# Patient Record
Sex: Male | Born: 1935 | Race: White | Hispanic: No | Marital: Married | Smoking: Former smoker
Health system: Southern US, Community
[De-identification: ages and names within clinical notes are randomized; demographics above are authoritative.]

## PROBLEM LIST (undated history)

## (undated) DIAGNOSIS — T4145XA Adverse effect of unspecified anesthetic, initial encounter: Secondary | ICD-10-CM

## (undated) DIAGNOSIS — Z9289 Personal history of other medical treatment: Secondary | ICD-10-CM

## (undated) DIAGNOSIS — G5601 Carpal tunnel syndrome, right upper limb: Secondary | ICD-10-CM

## (undated) DIAGNOSIS — Z789 Other specified health status: Secondary | ICD-10-CM

## (undated) DIAGNOSIS — I10 Essential (primary) hypertension: Secondary | ICD-10-CM

## (undated) DIAGNOSIS — M1990 Unspecified osteoarthritis, unspecified site: Secondary | ICD-10-CM

## (undated) DIAGNOSIS — I493 Ventricular premature depolarization: Secondary | ICD-10-CM

## (undated) DIAGNOSIS — Z86010 Personal history of colonic polyps: Secondary | ICD-10-CM

## (undated) DIAGNOSIS — R2689 Other abnormalities of gait and mobility: Secondary | ICD-10-CM

## (undated) DIAGNOSIS — N189 Chronic kidney disease, unspecified: Secondary | ICD-10-CM

## (undated) DIAGNOSIS — R609 Edema, unspecified: Secondary | ICD-10-CM

## (undated) DIAGNOSIS — N289 Disorder of kidney and ureter, unspecified: Secondary | ICD-10-CM

## (undated) DIAGNOSIS — E119 Type 2 diabetes mellitus without complications: Secondary | ICD-10-CM

## (undated) DIAGNOSIS — T8859XA Other complications of anesthesia, initial encounter: Secondary | ICD-10-CM

## (undated) DIAGNOSIS — Z8679 Personal history of other diseases of the circulatory system: Secondary | ICD-10-CM

## (undated) DIAGNOSIS — I82409 Acute embolism and thrombosis of unspecified deep veins of unspecified lower extremity: Secondary | ICD-10-CM

## (undated) DIAGNOSIS — K219 Gastro-esophageal reflux disease without esophagitis: Secondary | ICD-10-CM

## (undated) DIAGNOSIS — I509 Heart failure, unspecified: Secondary | ICD-10-CM

## (undated) DIAGNOSIS — M109 Gout, unspecified: Secondary | ICD-10-CM

## (undated) DIAGNOSIS — Z860101 Personal history of adenomatous and serrated colon polyps: Secondary | ICD-10-CM

## (undated) HISTORY — PX: TONSILLECTOMY: SUR1361

## (undated) HISTORY — DX: Edema, unspecified: R60.9

## (undated) HISTORY — DX: Personal history of adenomatous and serrated colon polyps: Z86.0101

## (undated) HISTORY — PX: APPENDECTOMY: SHX54

## (undated) HISTORY — DX: Carpal tunnel syndrome, right upper limb: G56.01

## (undated) HISTORY — PX: CATARACT EXTRACTION W/ INTRAOCULAR LENS IMPLANT: SHX1309

## (undated) HISTORY — PX: VENA CAVA FILTER PLACEMENT: SUR1032

## (undated) HISTORY — DX: Other abnormalities of gait and mobility: R26.89

## (undated) HISTORY — DX: Personal history of colonic polyps: Z86.010

## (undated) HISTORY — DX: Disorder of kidney and ureter, unspecified: N28.9

## (undated) HISTORY — DX: Chronic kidney disease, unspecified: N18.9

## (undated) HISTORY — DX: Personal history of other medical treatment: Z92.89

## (undated) HISTORY — DX: Other specified health status: Z78.9

## (undated) HISTORY — DX: Ventricular premature depolarization: I49.3

## (undated) HISTORY — DX: Essential (primary) hypertension: I10

## (undated) HISTORY — DX: Unspecified osteoarthritis, unspecified site: M19.90

## (undated) HISTORY — PX: EYE SURGERY: SHX253

## (undated) HISTORY — DX: Personal history of other diseases of the circulatory system: Z86.79

---

## 1989-08-01 ENCOUNTER — Encounter: Payer: Self-pay | Admitting: Gastroenterology

## 1998-06-06 ENCOUNTER — Encounter: Admission: RE | Admit: 1998-06-06 | Discharge: 1998-09-04 | Payer: Self-pay | Admitting: Internal Medicine

## 1999-04-21 ENCOUNTER — Encounter (INDEPENDENT_AMBULATORY_CARE_PROVIDER_SITE_OTHER): Payer: Self-pay | Admitting: Specialist

## 1999-04-21 ENCOUNTER — Other Ambulatory Visit: Admission: RE | Admit: 1999-04-21 | Discharge: 1999-04-21 | Payer: Self-pay | Admitting: Gastroenterology

## 2000-10-25 ENCOUNTER — Ambulatory Visit (HOSPITAL_COMMUNITY): Admission: RE | Admit: 2000-10-25 | Discharge: 2000-10-25 | Payer: Self-pay | Admitting: *Deleted

## 2001-10-31 ENCOUNTER — Encounter: Admission: RE | Admit: 2001-10-31 | Discharge: 2001-10-31 | Payer: Self-pay | Admitting: Internal Medicine

## 2001-12-19 HISTORY — PX: CARDIAC CATHETERIZATION: SHX172

## 2001-12-29 ENCOUNTER — Inpatient Hospital Stay (HOSPITAL_COMMUNITY): Admission: EM | Admit: 2001-12-29 | Discharge: 2002-01-04 | Payer: Self-pay | Admitting: Emergency Medicine

## 2001-12-29 ENCOUNTER — Encounter: Payer: Self-pay | Admitting: Emergency Medicine

## 2002-02-22 ENCOUNTER — Encounter: Payer: Self-pay | Admitting: *Deleted

## 2002-02-22 ENCOUNTER — Ambulatory Visit (HOSPITAL_COMMUNITY): Admission: RE | Admit: 2002-02-22 | Discharge: 2002-02-22 | Payer: Self-pay | Admitting: *Deleted

## 2002-02-28 ENCOUNTER — Encounter: Payer: Self-pay | Admitting: *Deleted

## 2002-02-28 ENCOUNTER — Ambulatory Visit (HOSPITAL_COMMUNITY): Admission: RE | Admit: 2002-02-28 | Discharge: 2002-02-28 | Payer: Self-pay | Admitting: *Deleted

## 2002-03-08 ENCOUNTER — Ambulatory Visit (HOSPITAL_COMMUNITY): Admission: RE | Admit: 2002-03-08 | Discharge: 2002-03-08 | Payer: Self-pay | Admitting: *Deleted

## 2002-07-31 ENCOUNTER — Ambulatory Visit (HOSPITAL_COMMUNITY): Admission: RE | Admit: 2002-07-31 | Discharge: 2002-07-31 | Payer: Self-pay | Admitting: *Deleted

## 2003-11-23 ENCOUNTER — Encounter (INDEPENDENT_AMBULATORY_CARE_PROVIDER_SITE_OTHER): Payer: Self-pay | Admitting: Gastroenterology

## 2003-11-25 ENCOUNTER — Ambulatory Visit (HOSPITAL_COMMUNITY): Admission: RE | Admit: 2003-11-25 | Discharge: 2003-11-25 | Payer: Self-pay | Admitting: Gastroenterology

## 2003-12-03 ENCOUNTER — Ambulatory Visit (HOSPITAL_COMMUNITY): Admission: RE | Admit: 2003-12-03 | Discharge: 2003-12-03 | Payer: Self-pay | Admitting: Gastroenterology

## 2004-08-01 ENCOUNTER — Ambulatory Visit: Payer: Self-pay | Admitting: Hematology & Oncology

## 2004-10-12 ENCOUNTER — Encounter: Admission: RE | Admit: 2004-10-12 | Discharge: 2004-10-12 | Payer: Self-pay | Admitting: Family Medicine

## 2004-12-25 ENCOUNTER — Inpatient Hospital Stay (HOSPITAL_COMMUNITY): Admission: EM | Admit: 2004-12-25 | Discharge: 2005-01-01 | Payer: Self-pay | Admitting: Emergency Medicine

## 2004-12-25 ENCOUNTER — Encounter (INDEPENDENT_AMBULATORY_CARE_PROVIDER_SITE_OTHER): Payer: Self-pay | Admitting: Cardiology

## 2005-11-18 ENCOUNTER — Ambulatory Visit (HOSPITAL_COMMUNITY): Admission: RE | Admit: 2005-11-18 | Discharge: 2005-11-18 | Payer: Self-pay | Admitting: *Deleted

## 2006-05-26 ENCOUNTER — Emergency Department (HOSPITAL_COMMUNITY): Admission: EM | Admit: 2006-05-26 | Discharge: 2006-05-26 | Payer: Self-pay | Admitting: Emergency Medicine

## 2007-07-18 ENCOUNTER — Encounter: Admission: RE | Admit: 2007-07-18 | Discharge: 2007-07-18 | Payer: Self-pay | Admitting: Family Medicine

## 2007-08-04 ENCOUNTER — Ambulatory Visit: Payer: Self-pay | Admitting: Gastroenterology

## 2007-08-29 ENCOUNTER — Ambulatory Visit: Payer: Self-pay | Admitting: Gastroenterology

## 2007-08-29 ENCOUNTER — Encounter: Payer: Self-pay | Admitting: Gastroenterology

## 2007-08-29 DIAGNOSIS — K222 Esophageal obstruction: Secondary | ICD-10-CM | POA: Insufficient documentation

## 2007-11-02 DIAGNOSIS — K219 Gastro-esophageal reflux disease without esophagitis: Secondary | ICD-10-CM

## 2007-11-02 DIAGNOSIS — K5730 Diverticulosis of large intestine without perforation or abscess without bleeding: Secondary | ICD-10-CM | POA: Insufficient documentation

## 2007-11-02 DIAGNOSIS — K649 Unspecified hemorrhoids: Secondary | ICD-10-CM | POA: Insufficient documentation

## 2007-11-02 DIAGNOSIS — I080 Rheumatic disorders of both mitral and aortic valves: Secondary | ICD-10-CM

## 2007-11-02 DIAGNOSIS — K449 Diaphragmatic hernia without obstruction or gangrene: Secondary | ICD-10-CM | POA: Insufficient documentation

## 2007-11-02 DIAGNOSIS — I10 Essential (primary) hypertension: Secondary | ICD-10-CM | POA: Insufficient documentation

## 2007-11-02 DIAGNOSIS — I5022 Chronic systolic (congestive) heart failure: Secondary | ICD-10-CM

## 2007-11-02 DIAGNOSIS — E785 Hyperlipidemia, unspecified: Secondary | ICD-10-CM

## 2008-07-04 ENCOUNTER — Ambulatory Visit: Admission: RE | Admit: 2008-07-04 | Discharge: 2008-07-04 | Payer: Self-pay | Admitting: Cardiology

## 2008-07-04 ENCOUNTER — Encounter (INDEPENDENT_AMBULATORY_CARE_PROVIDER_SITE_OTHER): Payer: Self-pay | Admitting: Cardiology

## 2008-07-04 ENCOUNTER — Ambulatory Visit: Payer: Self-pay | Admitting: Surgery

## 2008-07-04 ENCOUNTER — Inpatient Hospital Stay (HOSPITAL_COMMUNITY): Admission: EM | Admit: 2008-07-04 | Discharge: 2008-07-06 | Payer: Self-pay | Admitting: Emergency Medicine

## 2008-11-15 DIAGNOSIS — Z860100 Personal history of colon polyps, unspecified: Secondary | ICD-10-CM | POA: Insufficient documentation

## 2009-01-28 ENCOUNTER — Encounter: Admission: RE | Admit: 2009-01-28 | Discharge: 2009-01-28 | Payer: Self-pay | Admitting: Gastroenterology

## 2010-10-28 ENCOUNTER — Ambulatory Visit
Admission: RE | Admit: 2010-10-28 | Discharge: 2010-10-28 | Disposition: A | Discharge status: Home / Self Care | Payer: Medicare Other | Source: Ambulatory Visit | Attending: Cardiology | Admitting: Cardiology

## 2010-10-28 ENCOUNTER — Other Ambulatory Visit: Payer: Self-pay | Admitting: Cardiology

## 2010-10-28 DIAGNOSIS — I82409 Acute embolism and thrombosis of unspecified deep veins of unspecified lower extremity: Secondary | ICD-10-CM

## 2010-11-03 ENCOUNTER — Other Ambulatory Visit: Payer: Self-pay | Admitting: Interventional Radiology

## 2010-11-03 DIAGNOSIS — I82409 Acute embolism and thrombosis of unspecified deep veins of unspecified lower extremity: Secondary | ICD-10-CM

## 2010-11-11 ENCOUNTER — Other Ambulatory Visit (HOSPITAL_COMMUNITY): Payer: Medicare Other

## 2010-11-11 ENCOUNTER — Ambulatory Visit (HOSPITAL_COMMUNITY)
Admission: RE | Admit: 2010-11-11 | Discharge: 2010-11-11 | Disposition: A | Discharge status: Home / Self Care | Payer: Medicare Other | Source: Ambulatory Visit | Attending: Interventional Radiology | Admitting: Interventional Radiology

## 2010-11-11 DIAGNOSIS — I129 Hypertensive chronic kidney disease with stage 1 through stage 4 chronic kidney disease, or unspecified chronic kidney disease: Secondary | ICD-10-CM | POA: Insufficient documentation

## 2010-11-11 DIAGNOSIS — Z01812 Encounter for preprocedural laboratory examination: Secondary | ICD-10-CM | POA: Insufficient documentation

## 2010-11-11 DIAGNOSIS — I82409 Acute embolism and thrombosis of unspecified deep veins of unspecified lower extremity: Secondary | ICD-10-CM

## 2010-11-11 DIAGNOSIS — J4489 Other specified chronic obstructive pulmonary disease: Secondary | ICD-10-CM | POA: Insufficient documentation

## 2010-11-11 DIAGNOSIS — Z86718 Personal history of other venous thrombosis and embolism: Secondary | ICD-10-CM | POA: Insufficient documentation

## 2010-11-11 DIAGNOSIS — Z7901 Long term (current) use of anticoagulants: Secondary | ICD-10-CM | POA: Insufficient documentation

## 2010-11-11 DIAGNOSIS — N189 Chronic kidney disease, unspecified: Secondary | ICD-10-CM | POA: Insufficient documentation

## 2010-11-11 DIAGNOSIS — R269 Unspecified abnormalities of gait and mobility: Secondary | ICD-10-CM | POA: Insufficient documentation

## 2010-11-11 DIAGNOSIS — Z9181 History of falling: Secondary | ICD-10-CM | POA: Insufficient documentation

## 2010-11-11 DIAGNOSIS — M129 Arthropathy, unspecified: Secondary | ICD-10-CM | POA: Insufficient documentation

## 2010-11-11 DIAGNOSIS — Z86711 Personal history of pulmonary embolism: Secondary | ICD-10-CM | POA: Insufficient documentation

## 2010-11-11 DIAGNOSIS — J449 Chronic obstructive pulmonary disease, unspecified: Secondary | ICD-10-CM | POA: Insufficient documentation

## 2010-11-11 DIAGNOSIS — Z91041 Radiographic dye allergy status: Secondary | ICD-10-CM | POA: Insufficient documentation

## 2010-11-11 DIAGNOSIS — E119 Type 2 diabetes mellitus without complications: Secondary | ICD-10-CM | POA: Insufficient documentation

## 2010-11-11 LAB — PROTIME-INR
INR: 1.65 — ABNORMAL HIGH (ref 0.00–1.49)
Prothrombin Time: 19.7 seconds — ABNORMAL HIGH (ref 11.6–15.2)

## 2010-11-11 LAB — CBC
MCHC: 34.4 g/dL (ref 30.0–36.0)
Platelets: 103 10*3/uL — ABNORMAL LOW (ref 150–400)
RDW: 12.8 % (ref 11.5–15.5)

## 2010-11-11 LAB — APTT: aPTT: 37 seconds (ref 24–37)

## 2010-11-11 LAB — COMPREHENSIVE METABOLIC PANEL
ALT: 31 U/L (ref 0–53)
Chloride: 105 mEq/L (ref 96–112)
GFR calc non Af Amer: >60 mL/min (ref >60)
Glucose, Bld: 163 mg/dL — ABNORMAL HIGH (ref 70–99)
Potassium: 5.4 mEq/L — ABNORMAL HIGH (ref 3.5–5.1)
Sodium: 138 mEq/L (ref 135–145)

## 2010-11-13 LAB — GLUCOSE, CAPILLARY: Glucose-Capillary: 146 mg/dL — ABNORMAL HIGH (ref 70–99)

## 2011-02-02 NOTE — H&P (Signed)
Ethan Mclean, DRUMMOND NO.:  [id number]   MEDICAL RECORD NO.:  [id number]          PATIENT TYPE:  INP   LOCATION:  3703                         FACILITY:  [facility]   PHYSICIAN:  [name], M.D.DATE OF BIRTH:  06/04/1926   DATE OF ADMISSION:  07/04/2008  DATE OF DISCHARGE:                              HISTORY & PHYSICAL   ATTENDING PHYSICIAN:  [name], M.D.   PRIMARY CARE PHYSICIAN:  [name].   CARDIOLOGIST:  [name], M.D.   CHIEF COMPLAINT:  Blood clot.   HISTORY OF PRESENT ILLNESS:  The patient is a 77 year old white male  with a past medical history of cardiomyopathy, hypertension and previous  history of DVT who was followed by Dr. [name] for his  cardiomyopathy.  In regards to the patient's baseline, he has been noted  to have some deconditioning and his wife states that he spends almost  his entire day in a chair, and his only movement is to go to the  bathroom, or to come to the table for a meal.  Other than that, he stays  in his chair all day.  He does not participate in any activity secondary  to some weakness and balance issues.  This has been going on for quite  some time.  There have been no recent car trips, no leg injuries, no  recent illnesses where he has been bed ridden.  He has a previous  history of DVT which led to a PE.  When today he went to go see Dr.  Mayford Knife,  he noted to [name] that he had been having some edema,  which had gotten better on his right side, but his left edema was  persisting.  The patient was sent over to radiology for lower extremity  Doppler, which was found to be positive for a large DVT in the thigh  area.  When this was found  [facility] called myself for admission.  Currently the patient is  doing well.  He denies any headaches, vision changes, dysphagia, chest  pain, palpitations, shortness of breath, wheezing, coughing abdominal  pain, hematuria, dysuria,  constipation, diarrhea.  He does have some  left lower extremity swelling and some overall weakness and balance  issues, but no focal extremity numbness, weakness or pain.  Review of  systems otherwise negative.   PAST MEDICAL HISTORY:  Includes history of nonischemic cardiomyopathy  with an EF of 40-45%, asymptomatic PVCs, hypertension, previous history  of DVT with PE, cause unknown, history of rhabdomyolysis secondary  statins.   ALLERGIES:  He has allergies to SULFA, IV DYE AND CODEINE.   SOCIAL HISTORY:  No tobacco, alcohol or drug use.   FAMILY HISTORY:  Noncontributory.   MEDICATIONS:  The patient is on:  1. Allopurinol 300.  2. Lisinopril to 20 b.i.d.  3. Coreg 25 b.i.d. - 0.4.  4. Fish oil 1200 four times daily.  5. Coenzyme-Q 60 mg daily.  6. Tylenol 325 p.r.n.  7. Os-Cal D daily.  8. Prilosec 20 b.i.d.  9. Metamucil daily.  10.He is on  Lasix dose and the dose is not written.   PHYSICAL EXAMINATION:  VITALS:  On admission were temperature 97.8,  heart rate 70, blood pressure 143/84, respirations 18, O2 saturation 96%  on room air.  GENERAL:  He is alert and oriented x3, in no apparent distress.  HEENT: Normocephalic atraumatic.  Mucous membranes are moist.  He has no  carotid bruits.  HEART:  Regular rate and rhythm.  S1, S2.  LUNGS:  Clear to auscultation bilaterally.  ABDOMEN: Soft, nontender , nondistended.  Positive bowel sounds.  EXTREMITIES:  Showed no clubbing, cyanosis or edema.   There are no laboratories.  I have ordered a CBC and BMET as well as a  PT/INR for the morning.   ASSESSMENT?PLAN:  1. Deep vein thrombosis.  Likely this is from inactivity.  I see no      other cause.  He has a previous history of deep vein thrombosis,      but it was only 4 years ago so I see no reason to think that he has      some sort of coagulation defect.  I would expect him to have      symptoms much earlier on in life.  We will plan to put him on      Coumadin as  well as Lovenox starting tonight.  We will ask home      health to ensure that he is able to get Lovenox at home.  Once this      is set up, we will be able to discharge home.  2. Deconditioning.  We will get physical therapy to see.  3. History of cardiomyopathy, stable.  4. History of diabetes mellitus.  He is on diet control only.  We will      just check his CBGs twice a day.  If it is elevated, we will put      him on sliding scale.      [name], M.D.  Electronically Signed     SKK/MEDQ  D:  07/04/2008  T:  07/04/2008  Job:  [id number]   cc:   [name], M.D.  Robert L. Foy Guadalajara, M.D.

## 2011-02-02 NOTE — Assessment & Plan Note (Signed)
[facility]                         GASTROENTEROLOGY OFFICE NOTE   NAME:Ethan Mclean, SCORPIO FORTIN                       MRN:          [id number]  DATE:08/04/2007                            DOB:          25-Dec-1925    REFERRING PHYSICIAN:  Molly Maduro L. Foy Guadalajara, M.D.   REASON FOR REFERRAL:  Dysphagia and reflux symptoms.   HISTORY OF PRESENT ILLNESS:  Mr. Ethan Mclean is a 77 year old white male who  was previously seen by Dr. Victorino Dike.  He has a history of GERD with  dysphagia.  Upper endoscopy in March 2005 showed a hiatal hernia and an  empiric dilation was done.  There was a question of distal esophageal  varices.  Colonoscopy in March 2005 showed diverticulosis and  hemorrhoids.  He has a prior history of adenomatous colon polyps in  March 1987.  There was marked atypia and the possibility of carcinoma in  situ could not be excluded per the pathology report.  It sounds like  this may have been high-grade dysplasia, but that is not so stated.  He  relates worsening problems with dysphagia to some foods, pills, and some  liquids. He has globus sensation as well.  He takes Zantac on a p.r.n.  basis for intermittent reflux symptoms.  He notes no weight loss,  melena, hematochezia, change in bowel habits, or odynophagia.   PAST MEDICAL HISTORY:  Hypertension  congestive heart failure with an ejection fraction of 20%  Mitral regurgitation  Diabetes mellitus  Hyperlipidemia  Adenomatous colon polyps with probable high-grade dysplasia in 1987  diverticulosis  hemorrhoids  GERD  status post appendectomy  status post tonsillectomy.   CURRENT MEDICATIONS:  Listed on the chart, updated, and reviewed.   MEDICATION ALLERGIES:  SULFA DRUGS, IVP DYE.   SOCIAL HISTORY AND REVIEW OF SYSTEMS:  Per the hand-written form.   PHYSICAL EXAM:  Well-developed, well-nourished in no acute distress.  Height 5 feet 11-3/4 inches, weight 206.8, blood pressure 108/64, pulse  56 and  regular.  HEENT:  Anicteric sclerae, oropharynx clear.  CHEST:  Clear to auscultation bilaterally.  CARDIAC:  Regular rate and rhythm without murmurs appreciated.  ABDOMEN:  Soft, nontender, nondistended.  Normoactive bowel sounds.  No  palpable organomegaly, masses, or hernias.  He has a small, easily  reducible umbilical hernia.  NEUROLOGIC:  Alert and oriented x3.  Grossly nonfocal.   ASSESSMENT AND PLAN:  1. Dysphagia and reflux symptoms.  He has globus sensation.  Begin      pantoprazole 40 mg p.o. q. a.m. with long-term antireflux measures.      Discontinue Zantac.  Rule out peptic strictures, esophageal      varices, and other disorders.  Risks, benefits, and alternatives to      upper endoscopy with possible biopsy and possible dilation      discussed with the patient.  He consents to proceed.  This will be      scheduled electively.  2. Personal history of adenomatous colon polyps with presumed high-      grade dysplasia in 1987.  Recall colonoscopy recommended for March  2010Judie Petit T. Russella Dar, MD, [facility]  Electronically Signed    MTS/MedQ  DD: 08/11/2007  DT: 08/11/2007  Job #: [id number]   cc:   Molly Maduro L. Foy Guadal[place]ara, M.D.

## 2011-02-05 NOTE — Discharge Summary (Signed)
[place]. [facility]  Patient:    Ethan Mclean, Ethan Mclean Visit Number: [id number] MRN: [id number]          Service Type: MED Location: [phone] Attending Physician:  Meade Maw A Dictated by:   Anselm Lis, N.P. Admit Date:  12/29/2001 Discharge Date: 01/04/2002   CC:         Casimiro Needle B. Sherene Sires, M.D. [facility]  Marinda Elk, M.D.  Venetia Constable, M.D.   Discharge Summary  DATE OF BIRTH:  10/22/1925.  CONSULTANT:  Charlaine Dalton. Sherene Sires, M.D. PRIMARY CARE Gage Weant:  Marinda Elk, M.D., also Venetia Constable, M.D.  PROCEDURES: 1. December 29, 2001, 2 D echo revealing mildly dilated LV with markedly    decreased function, EF 20-30%.  Severe diffuse LV hypokinesis.  Severe MR.    Left atrial enlargement, 44 mm.  Mild TR. 2. January 01, 2002, right and left cardiac catheterization revealing normal    coronary arteries, moderate to severe MR.  Dilated ventricle, EF 20% visaul    analysis, 34% computer analysis.  DISCHARGE DIAGNOSES: 1. Ischemic dilated cardiomyopathy.    a. January 01, 2002, cardiac catheterization revealing dilated LV with LV 20%       by visual analysis, 34% computerized analysis.  Normal coronary       arteries.  A 2 D echo, EF approximately 20-30%. 2. Severe mitral valvular regurgitation, probably secondary to dilated    cardiomyopathy. 3. Systemic anticoagulation secondary to dilated cardiomyopathy with low    ejection fraction for prophylaxis against cardioembolic source of stroke.    At time of discharge, prothrombin time was 14.8, INR 1.2.  She was initiated    on subcutaneous Lovenox 100 mg p.o. b.i.d. post catheterization.  Will    continue on this via the medical assistance program.  She was also    initiated on Coumadin 5 mg p.o. q.d. started January 03, 2002.  Will follow    up with our pharmacist in our Coumadin clinic with anticipation of    discontinuation of Lovenox when INR greater than 2. 4. Dyspnea/congestive heart failure  secondary to nonischemic dilated    cardiomyopathy, compensated after diuresis.  Chest CT was negative for    pulmonary emboli. 5. Nonsustained ventricular tachycardia related to ischemic cardiomyopathy.    Initiated on amiodarone on a load this admission.  Also, samples were    provided.  Did receive some samples via Sherlean Foot here at the    hospital.  He will be enrolled in the patient assistance program via    manufacturer of medicine.  The patient was initiated in the hospital. 6. Adenopathy, mediastinal and hilar, by chest CT.  Setting of weight loss    approximately 30 pounds over the last two months.  Consult by Dr. Sherene Sires.    Patients weight loss was attributed by patient to his inability to taste    food.  Sinus films done February 2003 at [facility] were negative for evidence of    active sinusitis.  Dr. Sherene Sires plans to obtain a chest CT from [facility]    and have it compared to the current CT scan done here this admission at    [facility] to see if adenopathy has worsened.  So there raises the    possibility of lymphoma, of bronchogenic carcinoma, or other forms of    carcinoma.  If adenopathy is new, may need mediastinoscopy.  The previous    CT scan done at [facility] (written report) revealed "  borderline enlarged    periaortic and retrocaval nodes" but no specific measurements. 7. Nasal congestion, for which he was started on Zyrtec. 8. History of cough previously to angiotensin-converting enzyme inhibitor,    which resolved with discontinuation of that medication.  It was recommended    not to resume angiotensin-converting enzyme inhibitor per Dr. Sherene Sires.  Trial    of angiotensin receptor blocker would be okay. 9. History of "organizing pneumonia" superior segment of right upper lobe    approximately December 2002, which has improved/resolved, follow-up    studies.  PLAN:  The patient is discharged home in fair condition.  DISCHARGE MEDICATIONS: 1. (New) Amiodarone 200  mg tablet, two tablets t.i.d. on April 17, up to    and including April 20, then decrease to one tablet p.o. q.d. 2. (New) Coumadin 5 mg p.o. q.h.s. or otherwise as directed. 3. (New) Furosemide 40 mg p.o. q.d. 4. (New) Coreg 3.125 mg p.o. b.i.d. (one-half a 6.25 mg twice daily). 5. Allopurinol 300 mg p.o. q.d. 6. (New) Hydralazine 25 mg q.i.d. 7. (New) Lovenox 100 mg subcu b.i.d.  DIET:  Low salt, no added salt.  SPECIAL INSTRUCTIONS:  The patient is to call our clinic if he develops enlargement, swelling, or bruising in the groin area.  FOLLOW-UP: 1. Dr. Sandrea Hughs May 8 at 4:15 a.m. 2. Boykin Nearing in our congestive heart failure clinic Thursday, April 24,    at 11 a.m.  He will also have an EKG done by our nurse at 10:45 on that    same day. 3. Office of Dr. Meade Maw on Wednesday, April 30, at 10:45 a.m. 4. With [facility], our pharmacist in Coumadin clinic, Monday, April 21, at 12:30    p.m.  HISTORY OF PRESENT ILLNESS:  Ethan Mclean is a very pleasant gentleman who was admitted with a three-week history of PND and worsening dyspnea associated with severe orthopnea, immediate orthopnea the night of admission, April 11.  He was diagnosed with new-onset CHF and improved with diuresis. However, a chest CT done to rule out pulmonary emboli (did rule out) suggested mediastinal and hilar adenopathy.  Dr. Sandrea Hughs was consulted.  Dr. Sherene Sires had followed the patient in the past for an "organizing pneumonia" right upper lobe superior segment that improved and resolved on serial follow-up chest x-ray.  Review of his CT scan from [facility] noted "borderline enlarged periaortic and retrocaval nodes" but no specific measurements.  Dr. Sherene Sires plans to obtain that CT scan from [facility] and compare it to the CT scan here at [facility] to see if, in fact, the adenopathy has worsened since the  pneumonia resolved.  If so, he could be concerned about the possibility  of lymphoma as well as bronchogenic carcinoma or other forms of carcinoma.  Dr. Sherene Sires felt it less likely that this was sarcoid given his age and less likely to think it a source of infection, "reactive adenopathy," if the adenopathy had actually worsened since the infiltrates resolved.  The infiltrates for which Dr. Sherene Sires had originally seen this patient in January 2003 had completely resolved in the superior segment of the right lower lobe.  The patient had previously been evaluated by Dr. Meade Maw for palpitations.  Prior catheterization revealed normal coronary arteries with EF approximately 60%.  A 2 D echo was performed, revealing severe cardiomyopathy and severe MR.  Dr. Fraser Din suspected that the patient developed a nonischemic cardiomyopathy with secondary MR.  The patient ruled out for a myocardial  infarction by negative serial cardiac enzymes.  His TSH, serum folate and B12 were within normal range.  His CHF was compensated with diuresis, and blood pressure improved with adjustment in his medical regimen.  ACE inhibitor was not added to regimen secondary to his history of cough on this agent.  He was treated with hydralazine, low-dose Core, _____.  The patient did have frequent runs of three-beat ventricular tachycardia and frequent PVCs.  Dr. Fraser Din wondered if significant ventricular ectopy could be a considered etiology for his cardiomyopathy. Thus, the patient was started on amiodarone therapy to see if ventricular ectopy may be suppressed.  On January 01, 2002, the patient was taken to the catheterization lab for a right and left heart catheterization with the following results:  RA mean pressure 11.  RV 39/12.  PA mean 31.  Pulmonary capillary wedge pressure mean 26.  Aortic pressure 118/81.  Mean 92.  LV 117/24.  O2 saturations aorta 94%.  PA saturation 59%.  Cardiac output by thermodilution 4.0/1.85.  By Hiram Comber, 3.5/1.62.  Normal coronary arteries with moderate to  severe MR.  EF 20% by visual analysis, 34% by computer analysis.  The patient was initiated on Coumadin and subcu Lovenox, which will continue post discharge.  He will need to be followed in our Coumadin clinic for close monitoring of protime/INR as amiodarone dose is decreased.  LABORATORY STUDIES:  WBC 8.8, hemoglobin 15.9, hematocrit 46.3, platelets 114; 128 toward end of discharge.  Admission protime 15, INR of 1.2, PTT of 36. Discharge protime of 14.8, INR of 1.2.  Sodium 140, potassium of 3.8, chloride 110, CO2 21, glucose 131, BUN 18, creatinine 1.1.  LFTs within the normal range.  Albumin decreased at 3.4.  CK of 138, MB fraction 9.4, troponin I 0.01; second CK 111, MB fraction 5.5, troponin I of 0.02; third CK of 92, MB fraction of 5.2, troponin I 0.02.  TSH within the normal range at 4.994.  B12 578 (within normal range), serum folate within normal range at greater than 20.  Vitamin B6 37.6, slightly above reference range.  Chest CT was negative for pulmonary emboli but suggested mediastinal hilar adenopathy. Dictated by:   Anselm Lis, N.P. Attending Physician:  Mora Appl DD:  02/07/02 TD:  02/10/02 Job: [id number]

## 2011-02-05 NOTE — Cardiovascular Report (Signed)
[place]. [facility]  Patient:    Ethan Mclean, Ethan Mclean                       MRN: [id number] Proc. Date: 10/25/00 Adm. Date:  [id number] Disc. Date: [id number] Attending:  Meade Maw A CC:         Marinda Elk, M.D.   Cardiac Catheterization  REFERRING PHYSICIAN:  Marinda Elk, M.D.  INDICATIONS FOR PROCEDURE:  A 77 year old gentleman with atypical chest pain, stress Cardiolite revealing decreased uptake at the apex, unable to assess for redistribution and significant ventricular ectopy.  DESCRIPTION OF PROCEDURE:  After obtaining written informed consent, the patient was brought to the cardiac catheterization lab in the postabsorptive state.  Preoperative sedation was achieved with IV Versed.  The right groin was prepped and draped in the usual sterile fashion.  Local anesthesia was achieved using 1% Xylocaine.  T he 6 [place] hemostasis sheath was placed into the right femoral artery using a modified Seldinger technique.  Selective coronary angiography was performed using a JL4, JR4 Judkins catheter.  All catheter exchange were made over a guidewire.  Multiple views were obtained. Single plane ventriculogram was performed in the RAO position using a 6 French pigtail curved catheter.  FINDINGS:  Aorta pressure was 121/66, LV pressure was 120/10.  There was no gradient noted on pullback.  Single plane ventriculogram revealed apical hypokinesis with an ejection fraction of 50%.  Mitral regurgitation was difficult to assess secondary to ventricular ectopy.  CORONARY ANGIOGRAPHY:  Left main coronary artery:  The left main coronary artery gave bifurcated into the left anterior descending and circumflex vessel.  There was no significant disease in the left main coronary artery.  Left anterior descending:  The left anterior descending gave rise to a small diagonal #1, moderate diagonal #2 and ended as an apical recurrent branch. There was no significant disease  in the left anterior descending or its branches.  Circumflex vessel:  The circumflex vessel was dominant for the posterior circulation.  It was a large vessel and gave rise to a moderate sized OM-1, OM-2, OM-3 and a moderate sized PDA. There was no significant disease in the circumflex or its branches.  Right coronary artery:  The right coronary artery was a small, nondominant vessel.  IMPRESSION: 1. Apical hypokinesis. 2. Preserved ejection fraction. 3. Significant ventricular ectopy. 4. Normal coronary angiography.  RECOMMENDATIONS:  We will discontinue his Tiazac and initiate beta blockers to assess for better control of his ventricular ectopy.  Would continue with his lisinopril and in view of his risk factors of age, hypertension, and borderline diabetes, will continue with a baby aspirin daily. DD:  10/25/00 TD:  10/26/00 Job: [id number]

## 2011-02-05 NOTE — Cardiovascular Report (Signed)
[place]. [facility]  Patient:    Ethan Mclean, Ethan Mclean Visit Number: [id number] MRN: [id number]          Service Type: MED Location: [phone] Attending Physician:  Mora Appl Dictated by:   Meade Maw, M.D. Proc. Date: 01/01/02 Admit Date:  12/29/2001                          Cardiac Catheterization  PROCEDURES: 1. Left and right heart catheterization. 2. Single plane ventriculogram.  INDICATION FOR PROCEDURE:  Dilated cardiomyopathy.  DESCRIPTION OF PROCEDURE:  After obtaining written and informed consent, the patient was brought to the cardiac catheterization lab in the postabsorptive state.  Preobutundation as achieved using IV Versed.  The right groin was prepped and draped in the usual sterile fashion.  Local anesthesia was achieved using 1% Xylocaine.  An 8-French hemostasis sheath was placed into the right femoral vein using the modified Seldinger technique.  A 6-French hemostasis sheath was placed into the right femoral artery using the modified Seldinger technique.  Selective coronary angiography was performed using the JL4 and JR4 Judkins catheters; multiple views were obtained.  Single plane ventriculogram was performed in the RAO position, using a 6-French pigtail curved catheter.  Prior to left heart catheterization, right heart catheterization was performed.  An 8-French Oran Rein was advanced to the level of the pulmonary artery.  Pressures were obtained from the PA, pulmonary wedge, right ventricle and right atrial.  The Ernestine Conrad was then removed and left heart catheterization was performed.  Following the procedure there was no identifiable coronary artery disease. The ACT was checked and was found to be 134.  The patient was transferred to the holding area; there was no immediate complications.  HEMODYNAMICS:  1. Right atrial mean 11.  2. Right ventricle pressure: 39/12.  3. PA: Mean 31.  4. Pulmonary capillary wedge  pressure:  20-25.  5. Aortic pressure:  118/81.  6. Left ventricular pressure:  117/24.  7. Cardiac Output (thermodilution):  4.0 L/min with cardiac index 1.85.  8. Cardia Output (FICK):  3.5 L/min with an index of 1.62.  9. Aortic saturation:  94%. 10. PA saturation:  59%.  CORONARY ANGIOGRAPHY: 1. LEFT MAIN CORONARY ARTERY:  Bifurcates into the left anterior descending    and circumflex vessel.  There is no disease in the left main coronary. 2. LEFT ANTERIOR DESCENDING ARTERY:  Gives rise to a small D1, a moderate    D2 and goes on as an apical recurrent branch.  There is no disease noted    in the left anterior descending. 3. CIRCUMFLEX CORONARY ARTERY:  Dominant for the posterior circulation.  It is    a large vessel.  It gives rise to a moderately sized OM1, OM2, OM3 and goes    on to end as a PDA branch.  There is no disease in the circumflex or its    vessels. 4. RIGHT CORONARY ARTERY:  A small, nondominant vessel.  There is no disease    noted in the right coronary artery.  VENTRICULOGRAM:  The single plane ventriculogram reveals diffuse hypokinesis. There is moderately to moderately severe mitral regurgitation noted.  By visual analysis the ejection fraction is approximately 25%.  By measurement it is noted to be 34%.  FINAL IMPRESSION:  A nonischemic dilated cardiomyopathy.  There has been a normal serum ferritin, B12 and thyroid function tests obtained.  Etiologies may include significant ventricular ectopy as  the etiology of his cardiomyopathy.  The patient will be started on amiodarone therapy to see if his ventricular ectopy may be suppressed.   Dictated by:   Meade Maw, M.D.  Attending Physician:  Meade Maw A DD:  01/01/02 TD:  01/02/02 Job: [id number]

## 2011-02-05 NOTE — Consult Note (Signed)
[place]. [facility]  Patient:    Ethan Mclean, Ethan Mclean Visit Number: [id number] MRN: [id number]          Service Type: MED Location: [phone] Attending Physician:  Mora Appl Dictated by:   Charlaine Dalton. Sherene Sires, M.D. [facility] Proc. Date: 01/01/02 Admit Date:  12/29/2001   CC:         Meade Maw, M.D.  Venetia Constable, M.D.   Consultation Report  REFERRING PHYSICIAN:  Meade Maw, M.D.  REASON FOR CONSULTATION:  Abnormal chest x-ray and cough.  HISTORY OF PRESENT ILLNESS:  This is an exceptionally nice but quite complicated 77 year old white male, remote smoker, who I had seen initially in January at the request of Dr. Venetia Constable for a right lower lobe superior segment pneumonia that had clinically been present since December with a cough that I thought was disproportionate to infiltrates that I thought had resolved quite nicely on serial follow-up.  He had a CT scan performed initially at [facility] and then a subsequent at [facility] (dates unknown) to follow up these infiltrates.  I thought there was a significant "disconnect" between the infiltrates and the cough, which I thought were probably ACE inhibitor related.  I was convinced that the infiltrates were actually melting away with time consistent with a "organizing pneumonia."  On his last visit to see me on November 15, 2001, he stated his cough had totally resolved off of ACE inhibitors and his follow-up chest x-ray showed total resolution of the right lung infiltrate, which I believe was actually originally in the superior segment of the right lower lobe.  He comes back now with a three-week history of PND and worsening dyspnea associated with severe orthopnea, immediate orthopnea the night of admission on December 29, 2001.  Dr. Meade Maw evaluated him with new onset CHF and he is feeling better with diuresis.  However, his CT scan which was done to rule out pulmonary embolism suggest  mediastinal hilar adenopathy.  We do not have the old films available from [facility] or [facility] to compare with.  The patient states that the cough also has returned over the last several weeks, mostly on inspiration, dry in nature better with over-the-counter medications.  Note that he is no longer on ACE inhibitors.  He denies any fevers, chills, or sweats.  He has had weight loss that has attributed previously to inability to taste food.  For that reason, I had done a CT scan of the sinuses which revealed no evidence of active sinusitis by CT scan dated October 31, 2001 at [facility].  PAST MEDICAL HISTORY:  Appendectomy, tonsillectomy, and history of diabetes and hypertension.  ALLERGIES:  SULFA causes nonspecific reaction.  MEDICATIONS:  Hydralazine, Benadryl, Ecotrin, heparin, Imdur, K-Dur, Toprol, and Tylenol.  He is not on any bronchodilators.  SOCIAL HISTORY:  He quit smoking in 1953.  He denies any unusual travel, pet, or hobby exposure.  FAMILY HISTORY:  Positive for rheumatism in his father and negative for cancer, TB, atypia, atopia, or asthma.  REVIEW OF SYSTEMS:  Negative for any difficulty with swallowing, fevers, chills, night sweats.  Previously, he had lost 30 pounds according to my office notes with his last weight in our office recorded at 201 pounds.  All other review of systems negative including absence of significant leg swelling or calf pain.  PHYSICAL EXAMINATION:  GENERAL:  This is a quite pleasant, ambulatory white male in no acute distress.  VITAL SIGNS:  Stable vital signs.  HEENT:  Unremarkable.  Oropharynx is clear.  Trachea is midline.  NECK:  Supple without cervical adenopathy or tenderness.  LUNGS:  Lung fields reveal a few crackles on inspiration with cough on deep inspiratory maneuvers.  HEART:  There is a regular rate and rhythm with a soft S3.  S1 and S2 are slightly diminished.  No increase in pulmonic left S2.  ABDOMEN:  Soft and  benign.  No palpable organomegaly, masses, or tenderness. No ascites.  EXTREMITIES:  Warm without calf tenderness, clubbing, cyanosis, or edema.  NEUROLOGICAL:  No focal deficits.  Pathologic reflexes.  SKIN:  Warm and dry.  LABORATORY DATA:  Admission chest x-ray revealed cardiomegaly with interstitial edema.  CT scan from admission, as noted above, shows mediastinal hilar adenopathy.  IMPRESSION: 1. Clearly the new onset dyspnea associated with dry cough is most likely at    this point due to congestive heart failure based on Dr. Elyse Hsu    workup and I would recommend that he go ahead and be treated for it. 2. The long-term issues of weight loss and adenopathy are bothersome,    especially since he had a previous CT scan performed for which there is a    copy of in our notes from [facility] showing "borderline enlarged    periaortic and retrocaval nodes" but no specific measurements.  Therefore,    the key here is to obtain this CT scan from [facility] and have it    compared to the present CT scan from [facility]    (I will request this of radiology today) to see if in fact that    adenopathy has worsened since the pneumonia resolved.  If so, I would    be concerned about the possibility of lymphoma as well as bronchogenic    carcinoma or other forms of carcinoma.  I would be less likely to think    that is sarcoid given his age and less likely to think that a source    of infection "reactive adenopathy" if the adenopathy has actually    worsened since the infiltrates resolved.  The infiltrates for which I    originally saw this patient in January of 2003 have completely resolved    in the superior segment of the right lower lobe. 3. In terms of this cough, I would avoid ACE inhibitors based on the fact    that the cough totally resolved, according to my records, off of ACE     inhibitors and use angiotensins receptor blockers instead. 4. In terms  of the adenopathy, if it is new, mediastinoscopy may need to    be considered after we have his cardiac status totally optimized. Dictated by:   Charlaine Dalton. Sherene Sires, M.D. [facility] Attending Physician:  Mora Appl DD:  01/01/02 TD:  01/01/02 Job: [id number]

## 2011-02-05 NOTE — Consult Note (Signed)
NAMEDARIUSH, MCNELLIS NO.:  [id number]   MEDICAL RECORD NO.:  [id number]          PATIENT TYPE:  INP   LOCATION:  2037                         FACILITY:  [facility]   PHYSICIAN:  Sherin Quarry, MD      DATE OF BIRTH:  Oct 05, 1925   DATE OF CONSULTATION:  DATE OF DISCHARGE:                                   CONSULTATION   HISTORY OF PRESENT ILLNESS:  Ethan Mclean is a very pleasant 77 year old man  who presents to [facility] today with the acute onset  yesterday of pain in the chest radiating through to the back.  The pain is  described as localized to the left chest area.  The patient says the pain is  pleuritic in nature.  He was very uncomfortable last night, and when the  pain would not resolve he presented to the emergency room today.  On  presentation, his blood pressure was initially 168/100, O2 saturation was  96%.  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 Lowell Guitar had previously advised the patient that he should not  receive intravenous dye in the future, it was felt prudent to obtain a  nuclear medicine ventilation/perfusion scan rather than a CT scan of the  chest.  This study showed two subsegmental perfusion defects in the left  upper lobe with normal ventilation.  This finding was thought to be  consistent with an intermediate to high probability of pulmonary embolus.  Chest x-ray showed evidence of COPD and borderline cardiomegaly but no other  abnormalities.  The cardiac enzymes were negative.  CBC was within normal  limits.  D-dimer was markedly elevated at 9.25.  At this time, the patient  has been admitted and has been started on anticoagulation.  Mr. Ducharme  past medical history is quite significant.  In 2003 he was admitted with  symptoms of congestive heart failure and at that time a 2-D echo was done  which showed markedly decreased ventricular function with an ejection  fraction of  20% to 30% and severe diffuse hypokinesis.  He was also noted to  have severe mitral valve regurgitation.  During the course of that  hospitalization, he was placed on subcutaneous Lovenox and then was  discharged on Coumadin, which he took for an extended period of time.  He is  not exactly certain about how long he took Coumadin.  During the course of  that hospitalization the patient was also noted to have some evidence of  mediastinal and hilar adenopathy, and extensive studies were done to try to  characterize this problem.  Today, we are asked to comment about whether the  patient should have any further studies before initiating therapy for a  pulmonary embolus.   CURRENT MEDICATIONS:  1.  Allopurinol 300 mg daily.  2.  Aspirin 81 mg daily.  3.  Coreg 25 mg b.i.d.  4.  Welchol 1875 mg b.i.d.  5.  Lasix 40 mg daily.  6.  Lisinopril 40 mg daily.  7.  Protonix 40 mg daily.  8.  Lovenox 100 mg subcu every 12 hours.  9.  Phenergan as needed for nausea.   ALLERGIES:  THE PATIENT'S CHART INDICATES HE IS ALLERGIC TO SULFA DRUGS AND  ZETIA.   PAST MEDICAL HISTORY:  Remarkable for previously appendectomy,  tonsillectomy.  The patient is said to have a past history of diabetes and  hypertension.   SOCIAL HISTORY:  He quit smoking in 1953.  He denies abuse of alcohol.   FAMILY HISTORY:  Remarkable for arthritis in the patient's father.   REVIEW OF SYSTEMS:  The patient denied headache, he denied a productive  cough, he denied fever or chills, denied nausea or vomiting or abdominal  pain.   PHYSICAL EXAMINATION:  HEENT:  Within normal limits.  CHEST:  Clear.  CARDIOVASCULAR:  Normal S1 and S2 without rubs, murmurs or gallops.  ABDOMEN:  Benign.  NEUROLOGIC:  Unremarkable.  EXTREMITIES:  Unremarkable.   IMPRESSION:  In my opinion there is adequate evidence at this time to start  the patient on anticoagulation and to initiate Coumadin therapy.  First, the  patient presents with  classical symptoms for a pulmonary embolus.  Secondly,  his ventilation/perfusion scan shows two subsegmental perfusion defects in  the left upper lobe without matching ventilation defects.  Third, the  patient has a markedly elevated D-dimer which is also highly consistent with  a venous thrombosis.  All this information together makes me think that it  is not necessary to subject him to the risks of doing a CT scan of the chest  or pulmonary angiogram, and I would favor starting Lovenox, which is what  has already been done.  I would also suggest that the patient begin Coumadin  therapy.  I would agree with the patient's management as indicated, and we  will follow the patient with you and assist with his management.  Other  problem list includes 1) presentation highly consistent with pulmonary  embolus, 2) severe cardiomyopathy with ejection fraction of 20% to 30%, 3)  hypertension, 4) history of diabetes.      SY/MEDQ  D:  12/25/2004  T:  12/26/2004  Job:  [id number]   cc:   Meade Maw, M.D.  [address]  [place]  [place] [address]  Fax: [phone]   Maretta Los, MD

## 2011-02-05 NOTE — Discharge Summary (Signed)
NAMEJELAN, BATTERTON NO.:  [id number]   MEDICAL RECORD NO.:  [id number]          PATIENT TYPE:  INP   LOCATION:  2037                         FACILITY:  [facility]   PHYSICIAN:  Corinna L. Lendell Caprice, MDDATE OF BIRTH:  10-04-1925   DATE OF ADMISSION:  12/25/2004  DATE OF DISCHARGE:  01/01/2005                                 DISCHARGE SUMMARY   DIAGNOSES:  1.  Acute pulmonary embolus.  2.  Right leg deep vein thrombosis.  3.  Hypertension.  4.  Nonischemic cardiomyopathy.  5.  Nonsustained ventricular tachycardia.  6.  Acute renal insufficiency.  7.  Hyperlipidemia  8.  History of gout.   DISCHARGE MEDICATIONS:  1.  Coumadin 5 mg p.o. q.h.s.  2.  Prevacid 30 mg a day.  3.  Welchol 625 mg 3 tablets a day.  4.  Coreg 25 mg a day.  5.  Lisinopril 20 mg a day.  6.  Hold aspirin until off Coumadin.  7.  Allopurinol 300 mg a day.  8.  Claritin 10 mg a day.   CONDITION:  Stable.   FOLLOW UP:  With the Coumadin Clinic at Dr. Lindell Spar office on Monday January 04, 2005 at noon.  Follow up with Dr. Fraser Din Friday, Jan 22, 2005 at 12:30  p.m., follow up with Dr. Marinda Elk in two weeks.   ACTIVITY:  No heavy exertion for three days and then ad lib.   DIET:  Low salt, low cholesterol, Coumadin friendly.   PROCEDURES:  None.   PERTINENT LABORATORY DATA:  Myoglobin 486, troponin negative x3. HDL 34, LDL  105, VLDL 22, triglycerides 111, total cholesterol 161, CPK 136, CPK-MB 6.5,  index 4.8.  Complete metabolic panel significant for an albumin of 3.0  otherwise fairly unremarkable. Initial INR was 1.2, at discharge it was 2.2.  D-dimer on admission was 9.25. CBC unremarkable.  Creatinine on April 11 was  1.9, on April 12 /12 his creatinine was 1.6.   SPECIAL STUDIES AND RADIOLOGY:  Echocardiogram showed an ejection fraction  of 45-50%, increase relative contribution of atrial contraction to left  ventricular filling. EKG showed normal sinus rhythm with PVCs, left  axis  deviation. Doppler of the legs showed a nonocclusive DVT of the popliteal  vein on the right. Chest x-ray showed COPD and borderline cardiomegaly. VQ  scan showed two subsegmental perfusion defects in the left upper lobe with  normal ventilation, intermediate to high probability of pulmonary embolus.   HISTORY AND HOSPITAL COURSE:  Ethan Mclean is a pleasant 77 year old white male  who presented to the emergency room with chest pain which was pleuritic.  He  was admitted to Dr. Lindell Spar service. The hospitalists were consulted due  to his abnormal VQ scan. The clinical suspicion of pulmonary embolus was  quite high and he did in fact have a DVT in the right leg. He was therefore  started on Lovenox and Coumadin. He did not receive a CT of the chest due to  his renal insufficiency. His chest pain improved. He had no shortness of  breath. During his hospitalization,  he did have several episodes of  nonsustained ventricular tachycardia which according to Dr. Fraser Din has been  longstanding. He is currently therapeutic on his Coumadin and will need six  months of Coumadin. I have held his aspirin until his Coumadin is stopped.      CLS/MEDQ  D:  01/01/2005  T:  01/01/2005  Job:  [id number]   cc:   Meade Maw, M.D.  [address]  [place]  [place] [address]  Fax: [phone]   [name]. Foy Guadalajara, M.D.  [address] [place]  [place] [address]  Fax: [phone]

## 2011-06-21 LAB — BASIC METABOLIC PANEL
Calcium: 8.9
GFR calc Af Amer: >60
Glucose, Bld: 86
Sodium: 137

## 2011-06-21 LAB — CBC
HCT: 40.3
HCT: 41
HCT: 43.7
Hemoglobin: 13.3
Hemoglobin: 13.9
MCHC: 33.1
MCV: 101.5 — ABNORMAL HIGH
MCV: 99.9
Platelets: 90 — ABNORMAL LOW
RBC: 4.1 — ABNORMAL LOW
RBC: 4.31
RDW: 14.8
WBC: 5.6
WBC: 6.9
WBC: 8

## 2011-06-21 LAB — GLUCOSE, CAPILLARY
Glucose-Capillary: 106 — ABNORMAL HIGH
Glucose-Capillary: 113 — ABNORMAL HIGH
Glucose-Capillary: 95

## 2011-06-21 LAB — PROTIME-INR
INR: 1.3
Prothrombin Time: 17.1 — ABNORMAL HIGH

## 2014-01-11 ENCOUNTER — Inpatient Hospital Stay (HOSPITAL_COMMUNITY)
Admission: EM | Admit: 2014-01-11 | Discharge: 2014-01-15 | DRG: 389 | Disposition: A | Discharge status: Skilled Nursing Facility | Payer: Medicare Other | Attending: Internal Medicine | Admitting: Internal Medicine

## 2014-01-11 ENCOUNTER — Emergency Department (HOSPITAL_COMMUNITY): Payer: Medicare Other

## 2014-01-11 ENCOUNTER — Encounter (HOSPITAL_COMMUNITY): Payer: Self-pay | Admitting: Emergency Medicine

## 2014-01-11 DIAGNOSIS — K222 Esophageal obstruction: Secondary | ICD-10-CM

## 2014-01-11 DIAGNOSIS — N138 Other obstructive and reflux uropathy: Secondary | ICD-10-CM | POA: Diagnosis present

## 2014-01-11 DIAGNOSIS — I080 Rheumatic disorders of both mitral and aortic valves: Secondary | ICD-10-CM

## 2014-01-11 DIAGNOSIS — K649 Unspecified hemorrhoids: Secondary | ICD-10-CM

## 2014-01-11 DIAGNOSIS — R9431 Abnormal electrocardiogram [ECG] [EKG]: Secondary | ICD-10-CM | POA: Diagnosis present

## 2014-01-11 DIAGNOSIS — Z86718 Personal history of other venous thrombosis and embolism: Secondary | ICD-10-CM

## 2014-01-11 DIAGNOSIS — K219 Gastro-esophageal reflux disease without esophagitis: Secondary | ICD-10-CM | POA: Diagnosis present

## 2014-01-11 DIAGNOSIS — I10 Essential (primary) hypertension: Secondary | ICD-10-CM | POA: Diagnosis present

## 2014-01-11 DIAGNOSIS — D696 Thrombocytopenia, unspecified: Secondary | ICD-10-CM | POA: Diagnosis present

## 2014-01-11 DIAGNOSIS — I5023 Acute on chronic systolic (congestive) heart failure: Secondary | ICD-10-CM

## 2014-01-11 DIAGNOSIS — E119 Type 2 diabetes mellitus without complications: Secondary | ICD-10-CM | POA: Diagnosis present

## 2014-01-11 DIAGNOSIS — E876 Hypokalemia: Secondary | ICD-10-CM | POA: Diagnosis present

## 2014-01-11 DIAGNOSIS — I428 Other cardiomyopathies: Secondary | ICD-10-CM | POA: Diagnosis present

## 2014-01-11 DIAGNOSIS — K449 Diaphragmatic hernia without obstruction or gangrene: Secondary | ICD-10-CM

## 2014-01-11 DIAGNOSIS — Z87891 Personal history of nicotine dependence: Secondary | ICD-10-CM

## 2014-01-11 DIAGNOSIS — Z79899 Other long term (current) drug therapy: Secondary | ICD-10-CM

## 2014-01-11 DIAGNOSIS — R197 Diarrhea, unspecified: Secondary | ICD-10-CM

## 2014-01-11 DIAGNOSIS — I5022 Chronic systolic (congestive) heart failure: Secondary | ICD-10-CM | POA: Diagnosis present

## 2014-01-11 DIAGNOSIS — K529 Noninfective gastroenteritis and colitis, unspecified: Secondary | ICD-10-CM

## 2014-01-11 DIAGNOSIS — I509 Heart failure, unspecified: Secondary | ICD-10-CM | POA: Diagnosis present

## 2014-01-11 DIAGNOSIS — N401 Enlarged prostate with lower urinary tract symptoms: Secondary | ICD-10-CM | POA: Diagnosis present

## 2014-01-11 DIAGNOSIS — R339 Retention of urine, unspecified: Secondary | ICD-10-CM | POA: Diagnosis present

## 2014-01-11 DIAGNOSIS — Z86010 Personal history of colonic polyps: Secondary | ICD-10-CM

## 2014-01-11 DIAGNOSIS — E785 Hyperlipidemia, unspecified: Secondary | ICD-10-CM

## 2014-01-11 DIAGNOSIS — K5289 Other specified noninfective gastroenteritis and colitis: Secondary | ICD-10-CM | POA: Diagnosis present

## 2014-01-11 DIAGNOSIS — K567 Ileus, unspecified: Secondary | ICD-10-CM | POA: Diagnosis present

## 2014-01-11 DIAGNOSIS — Z95828 Presence of other vascular implants and grafts: Secondary | ICD-10-CM

## 2014-01-11 DIAGNOSIS — R600 Localized edema: Secondary | ICD-10-CM

## 2014-01-11 DIAGNOSIS — K560 Paralytic ileus: Principal | ICD-10-CM | POA: Diagnosis present

## 2014-01-11 DIAGNOSIS — Z7982 Long term (current) use of aspirin: Secondary | ICD-10-CM

## 2014-01-11 DIAGNOSIS — K5730 Diverticulosis of large intestine without perforation or abscess without bleeding: Secondary | ICD-10-CM

## 2014-01-11 HISTORY — DX: Gout, unspecified: M10.9

## 2014-01-11 HISTORY — DX: Adverse effect of unspecified anesthetic, initial encounter: T41.45XA

## 2014-01-11 HISTORY — DX: Heart failure, unspecified: I50.9

## 2014-01-11 HISTORY — DX: Type 2 diabetes mellitus without complications: E11.9

## 2014-01-11 HISTORY — DX: Gastro-esophageal reflux disease without esophagitis: K21.9

## 2014-01-11 HISTORY — DX: Acute embolism and thrombosis of unspecified deep veins of unspecified lower extremity: I82.409

## 2014-01-11 HISTORY — DX: Other complications of anesthesia, initial encounter: T88.59XA

## 2014-01-11 LAB — COMPREHENSIVE METABOLIC PANEL
ALT: 11 U/L (ref 0–53)
AST: 16 U/L (ref 0–37)
Albumin: 2.7 g/dL — ABNORMAL LOW (ref 3.5–5.2)
Alkaline Phosphatase: 57 U/L (ref 39–117)
BUN: 25 mg/dL — ABNORMAL HIGH (ref 6–23)
CALCIUM: 8.5 mg/dL (ref 8.4–10.5)
CO2: 22 meq/L (ref 19–32)
CREATININE: 0.99 mg/dL (ref 0.50–1.35)
Chloride: 104 mEq/L (ref 96–112)
GFR, EST AFRICAN AMERICAN: 84 mL/min — AB (ref >90)
GFR, EST NON AFRICAN AMERICAN: 73 mL/min — AB (ref >90)
GLUCOSE: 94 mg/dL (ref 70–99)
Potassium: 4.6 mEq/L (ref 3.7–5.3)
SODIUM: 139 meq/L (ref 137–147)
TOTAL PROTEIN: 6.5 g/dL (ref 6.0–8.3)
Total Bilirubin: 0.5 mg/dL (ref 0.3–1.2)

## 2014-01-11 LAB — CBC WITH DIFFERENTIAL/PLATELET
Basophils Absolute: 0 10*3/uL (ref 0.0–0.1)
Basophils Relative: 0 % (ref 0–1)
EOS ABS: 0.1 10*3/uL (ref 0.0–0.7)
EOS PCT: 1 % (ref 0–5)
HEMATOCRIT: 39.1 % (ref 39.0–52.0)
Hemoglobin: 13.7 g/dL (ref 13.0–17.0)
LYMPHS ABS: 1.9 10*3/uL (ref 0.7–4.0)
LYMPHS PCT: 25 % (ref 12–46)
MCH: 32.9 pg (ref 26.0–34.0)
MCHC: 35 g/dL (ref 30.0–36.0)
MCV: 93.8 fL (ref 78.0–100.0)
MONO ABS: 0.8 10*3/uL (ref 0.1–1.0)
Monocytes Relative: 11 % (ref 3–12)
Neutro Abs: 4.8 10*3/uL (ref 1.7–7.7)
Neutrophils Relative %: 63 % (ref 43–77)
PLATELETS: 97 10*3/uL — AB (ref 150–400)
RBC: 4.17 MIL/uL — AB (ref 4.22–5.81)
RDW: 13.6 % (ref 11.5–15.5)
WBC: 7.6 10*3/uL (ref 4.0–10.5)

## 2014-01-11 LAB — URINALYSIS, ROUTINE W REFLEX MICROSCOPIC
Bilirubin Urine: NEGATIVE
Glucose, UA: NEGATIVE mg/dL
KETONES UR: NEGATIVE mg/dL
LEUKOCYTES UA: NEGATIVE
NITRITE: NEGATIVE
PROTEIN: 30 mg/dL — AB
Specific Gravity, Urine: 1.024 (ref 1.005–1.030)
UROBILINOGEN UA: 0.2 mg/dL (ref 0.0–1.0)
pH: 5 (ref 5.0–8.0)

## 2014-01-11 LAB — URINE MICROSCOPIC-ADD ON

## 2014-01-11 LAB — PROTIME-INR
INR: 1.29 (ref 0.00–1.49)
PROTHROMBIN TIME: 15.8 s — AB (ref 11.6–15.2)

## 2014-01-11 LAB — I-STAT TROPONIN, ED: TROPONIN I, POC: 0.03 ng/mL (ref 0.00–0.08)

## 2014-01-11 LAB — PRO B NATRIURETIC PEPTIDE: Pro B Natriuretic peptide (BNP): 9946 pg/mL — ABNORMAL HIGH (ref 0–450)

## 2014-01-11 MED ORDER — SODIUM CHLORIDE 0.9 % IV BOLUS (SEPSIS)
500.0000 mL | Freq: Once | INTRAVENOUS | Status: AC
  Administered 2014-01-11: 500 mL via INTRAVENOUS

## 2014-01-11 MED ORDER — SODIUM CHLORIDE 0.9 % IV SOLN
INTRAVENOUS | Status: AC
  Administered 2014-01-11: 21:00:00 via INTRAVENOUS

## 2014-01-11 MED ORDER — METRONIDAZOLE IN NACL 5-0.79 MG/ML-% IV SOLN
500.0000 mg | Freq: Once | INTRAVENOUS | Status: AC
  Administered 2014-01-11: 500 mg via INTRAVENOUS
  Filled 2014-01-11: qty 100

## 2014-01-11 MED ORDER — SODIUM CHLORIDE 0.9 % IV BOLUS (SEPSIS): 1000.0000 mL | Freq: Once | INTRAVENOUS | Status: DC

## 2014-01-11 NOTE — ED Notes (Signed)
Family in the room. Denies any needs at this time. Patient still away for Xray.

## 2014-01-11 NOTE — ED Notes (Signed)
Pt not able to void at this time.

## 2014-01-11 NOTE — ED Notes (Signed)
Nurse Forde Dandy at the bedside cleaning patient.

## 2014-01-11 NOTE — ED Notes (Signed)
Pt returned from CT. Nurse Forde Dandy at the bedside reconnecting patient to the monitor.

## 2014-01-11 NOTE — ED Notes (Signed)
Patient given [place] sandwich and apple sauce to help with a bowel movement.

## 2014-01-11 NOTE — ED Notes (Signed)
Patient placed on Contact Precautions.

## 2014-01-11 NOTE — ED Provider Notes (Signed)
CSN: [id number]     Arrival date & time 01/11/14  1419 History   First MD Initiated Contact with Patient 01/11/14 1420     Chief Complaint  Patient presents with  . Diarrhea     (Consider location/radiation/quality/duration/timing/severity/associated sxs/prior Treatment) The history is provided by the patient and the spouse.  Ethan Mclean is a 80 y.o. male hx of GERD, here with diarrhea for 3 weeks. Watery diarrhea and loose stools for 3 weeks. He denies any nausea or vomiting. Denies any abdominal pain. He also has been progressively weak and now cannot stand up from his commode. Denies any fevers or chills. Family called PMD, who sent him in for evaluation. Denies history of C diff. Denies recent antibiotic use.    History reviewed. No pertinent past medical history. Past Surgical History  Procedure Laterality Date  . Appendicitis    . Appendectomy     History reviewed. No pertinent family history. History  Substance Use Topics  . Smoking status: Former Games developer  . Smokeless tobacco: Never Used  . Alcohol Use: No    Review of Systems  Gastrointestinal: Positive for diarrhea.  Neurological: Positive for weakness.  All other systems reviewed and are negative.     Allergies  Iohexol and Sulfonamide derivatives  Home Medications   Prior to Admission medications   Medication Sig Start Date End Date Taking? Authorizing Provider  acetaminophen (TYLENOL) 500 MG tablet Take 500 mg by mouth every other day as needed for mild pain.   Yes Historical Provider, MD  aspirin EC 325 MG tablet Take 325 mg by mouth every other day.   Yes Historical Provider, MD  calcium carbonate (TUMS - DOSED IN MG ELEMENTAL CALCIUM) 500 MG chewable tablet Chew 250 mg by mouth every other day.   Yes Historical Provider, MD  carvedilol (COREG) 25 MG tablet Take 25 mg by mouth 2 (two) times daily with a meal.   Yes Historical Provider, MD  cholecalciferol (VITAMIN D) 1000 UNITS tablet Take 1,000 Units by  mouth daily.   Yes Historical Provider, MD  furosemide (LASIX) 20 MG tablet Take 20 mg by mouth daily as needed for fluid.   Yes Historical Provider, MD  lisinopril (PRINIVIL,ZESTRIL) 20 MG tablet Take 20 mg by mouth daily.   Yes Historical Provider, MD  loperamide (IMODIUM) 2 MG capsule Take 2 mg by mouth daily as needed for diarrhea or loose stools.   Yes Historical Provider, MD  loratadine (CLARITIN) 10 MG tablet Take 10 mg by mouth every evening.   Yes Historical Provider, MD  Omega-3 Fatty Acids (OMEGA 3 PO) Take 2 capsules by mouth 2 (two) times daily.   Yes Historical Provider, MD  omeprazole (PRILOSEC OTC) 20 MG tablet Take 20 mg by mouth every other day.   Yes Historical Provider, MD  oxymetazoline (AFRIN) 0.05 % nasal spray Place 1 spray into both nostrils 2 (two) times daily as needed for congestion.   Yes Historical Provider, MD  Polyethyl Glycol-Propyl Glycol (SYSTANE) 0.4-0.3 % SOLN Place 1 drop into both eyes 2 (two) times daily.   Yes Historical Provider, MD  Probiotic Product (PROBIOTIC COLON SUPPORT) CAPS Take 1 capsule by mouth daily.   Yes Historical Provider, MD  sodium chloride (OCEAN) 0.65 % SOLN nasal spray Place 1 spray into both nostrils as needed for congestion.   Yes Historical Provider, MD  terazosin (HYTRIN) 2 MG capsule Take 2 mg by mouth at bedtime.   Yes Historical Provider, MD   BP  118/64  Pulse 73  Temp(Src) 97.8 F (36.6 C) (Oral)  Resp 19  SpO2 98% Physical Exam  Nursing note and vitals reviewed. Constitutional: He is oriented to person, place, and time.  Chronically ill   HENT:  Head: Normocephalic.  Mouth/Throat: Oropharynx is clear and moist.  Eyes: Conjunctivae are normal. Pupils are equal, round, and reactive to light.  Neck: Normal range of motion. Neck supple.  Cardiovascular: Normal rate, regular rhythm and normal heart sounds.   Pulmonary/Chest: Effort normal and breath sounds normal. No respiratory distress. He has no wheezes. He has no  rales.  Abdominal: Soft.  Slightly distended, nontender   Musculoskeletal: Normal range of motion.  2+ edema bilaterally (chronic), no calf tenderness   Neurological: He is alert and oriented to person, place, and time. No cranial nerve deficit. Coordination normal.  Skin: Skin is warm and dry.  Psychiatric: He has a normal mood and affect. His behavior is normal. Judgment and thought content normal.    ED Course  Procedures (including critical care time) Labs Review Labs Reviewed  CBC WITH DIFFERENTIAL - Abnormal; Notable for the following:    RBC 4.17 (*)    Platelets 97 (*)    All other components within normal limits  COMPREHENSIVE METABOLIC PANEL - Abnormal; Notable for the following:    BUN 25 (*)    Albumin 2.7 (*)    GFR calc non Af Amer 73 (*)    GFR calc Af Amer 84 (*)    All other components within normal limits  URINALYSIS, ROUTINE W REFLEX MICROSCOPIC - Abnormal; Notable for the following:    Hgb urine dipstick MODERATE (*)    Protein, ur 30 (*)    All other components within normal limits  PROTIME-INR - Abnormal; Notable for the following:    Prothrombin Time 15.8 (*)    All other components within normal limits  PRO B NATRIURETIC PEPTIDE - Abnormal; Notable for the following:    Pro B Natriuretic peptide (BNP) 9946.0 (*)    All other components within normal limits  URINE MICROSCOPIC-ADD ON - Abnormal; Notable for the following:    Casts HYALINE CASTS (*)    All other components within normal limits  CLOSTRIDIUM DIFFICILE BY PCR  STOOL CULTURE  OVA AND PARASITE EXAMINATION  I-STAT TROPOININ, ED    Imaging Review Ct Abdomen Pelvis Wo Contrast  01/11/2014   CLINICAL DATA:  Diarrhea, ileus, history diabetes, hypertension, CHF, colonic diverticulosis  EXAM: CT ABDOMEN AND PELVIS WITHOUT CONTRAST  TECHNIQUE: Multidetector CT imaging of the abdomen and pelvis was performed following the standard protocol without intravenous contrast. Sagittal and coronal MPR  images reconstructed from axial data set. Patient drank dilute oral contrast for exam. IV contrast not administered due to history of Iohexol allergy.  COMPARISON:  CT abdomen 12/03/2003  FINDINGS: Small bibasilar pleural effusions and atelectasis.  Extensive atherosclerotic calcifications including coronary arteries.  IVC filter noted.  Within limits of a nonenhanced exam, no focal abnormalities of the liver, spleen, pancreas, or adrenal glands.  Bilateral renal cortical atrophy with a mid right renal cyst 3.8 x 2.5 cm image 40.  No urinary tract calcification or dilatation.  Small supraumbilical ventral hernia containing fat, fascial defect 13 x 12 mm.  Prostatic enlargement, gland 6.5 x 5.0 x 6 8 cm.  Unremarkable bladder and ureters.  Sigmoid diverticulosis without evidence of diverticulitis.  Appendix surgically absent by history.  Nonspecific stranding identified at the inferior aspect of the right perinephric space and  Zuckerkandl's fascia, nonspecific, with additional mild stranding seen within the presacral fat.  Question bowel wall thickening of ascending colon versus artifact from underdistention.  Stomach and remaining bowel loops unremarkable.  No mass, adenopathy, ascites or free air.  Bones demineralized.  Bilateral fatty replacement of the gluteus minimus.  IMPRESSION: Small bibasilar pleural effusions and atelectasis.  Small supraumbilical ventral hernia containing fat.  Prostatic enlargement.  Sigmoid diverticulosis without evidence of diverticulitis.  Questionable wall thickening of the ascending colon versus artifact from underdistention; unable to exclude colonic neoplasm or colitis; recommend correlation with colonoscopy to exclude tumor.   Electronically Signed   By: Ulyses Southward M.D.   On: 01/11/2014 19:03   Dg Abd Acute W/chest  01/11/2014   CLINICAL DATA:  Diarrhea.  EXAM: ACUTE ABDOMEN SERIES (ABDOMEN 2 VIEW & CHEST 1 VIEW)  COMPARISON:  Two-view chest x-ray 06/22/2013  FINDINGS: The  heart is enlarged. Atherosclerotic calcifications are again seen in the aortic. The lungs are clear.  Fluid levels are present on the upright images without significant bowel dilation. There is no free air. An IVC filter is in place. Degenerative changes are noted in the lower lumbar spine.  IMPRESSION: 1. No acute cardiopulmonary disease. 2. Fluid levels without dilated bowel. This may represent a diffuse ileus.   Electronically Signed   By: Gennette Pac M.D.   On: 01/11/2014 15:58     EKG Interpretation None      MDM   Final diagnoses:  None   Ethan Mclean is a 80 y.o. male here with diarrhea and progressive weakness. Will check xray. Will check labs and stool.   4 pm  Xray showed ileus. Will order CT.   7:56 PM CT showed possible colitis. High suspicion for C diff. Will give flagyl. C diff and stool culture sent. Will admit.    Richardean Canal, MD 01/11/14 [phone]

## 2014-01-11 NOTE — ED Notes (Signed)
Dr. Silverio Lay at the bedside speaking with family.

## 2014-01-11 NOTE — ED Notes (Signed)
Ct made aware that pt finished oral contrast

## 2014-01-11 NOTE — ED Notes (Signed)
Per EMS, patient has had diarrhea for three weeks. Patient denies any nausea and vomiting. Patient was sent from PCP office.

## 2014-01-12 ENCOUNTER — Encounter (HOSPITAL_COMMUNITY): Payer: Self-pay | Admitting: Internal Medicine

## 2014-01-12 DIAGNOSIS — R600 Localized edema: Secondary | ICD-10-CM | POA: Diagnosis present

## 2014-01-12 DIAGNOSIS — K529 Noninfective gastroenteritis and colitis, unspecified: Secondary | ICD-10-CM

## 2014-01-12 DIAGNOSIS — K560 Paralytic ileus: Principal | ICD-10-CM

## 2014-01-12 DIAGNOSIS — I509 Heart failure, unspecified: Secondary | ICD-10-CM

## 2014-01-12 DIAGNOSIS — Z95828 Presence of other vascular implants and grafts: Secondary | ICD-10-CM

## 2014-01-12 DIAGNOSIS — R9431 Abnormal electrocardiogram [ECG] [EKG]: Secondary | ICD-10-CM

## 2014-01-12 DIAGNOSIS — K5289 Other specified noninfective gastroenteritis and colitis: Secondary | ICD-10-CM

## 2014-01-12 DIAGNOSIS — R197 Diarrhea, unspecified: Secondary | ICD-10-CM

## 2014-01-12 LAB — CBC
HEMATOCRIT: 35 % — AB (ref 39.0–52.0)
HEMOGLOBIN: 12.1 g/dL — AB (ref 13.0–17.0)
MCH: 32.4 pg (ref 26.0–34.0)
MCHC: 34.6 g/dL (ref 30.0–36.0)
MCV: 93.6 fL (ref 78.0–100.0)
Platelets: 85 10*3/uL — ABNORMAL LOW (ref 150–400)
RBC: 3.74 MIL/uL — ABNORMAL LOW (ref 4.22–5.81)
RDW: 13.6 % (ref 11.5–15.5)
WBC: 5.6 10*3/uL (ref 4.0–10.5)

## 2014-01-12 LAB — TROPONIN I
Troponin I: <0.3 ng/mL (ref <0.30)
Troponin I: <0.3 ng/mL (ref <0.30)

## 2014-01-12 LAB — HEMOGLOBIN A1C
Hgb A1c MFr Bld: 5.9 % — ABNORMAL HIGH (ref <5.7)
Mean Plasma Glucose: 123 mg/dL — ABNORMAL HIGH (ref <117)

## 2014-01-12 LAB — COMPREHENSIVE METABOLIC PANEL
ALT: 9 U/L (ref 0–53)
AST: 13 U/L (ref 0–37)
Albumin: 2.2 g/dL — ABNORMAL LOW (ref 3.5–5.2)
Alkaline Phosphatase: 47 U/L (ref 39–117)
BUN: 21 mg/dL (ref 6–23)
CALCIUM: 8.2 mg/dL — AB (ref 8.4–10.5)
CO2: 20 mEq/L (ref 19–32)
Chloride: 109 mEq/L (ref 96–112)
Creatinine, Ser: 0.9 mg/dL (ref 0.50–1.35)
GFR calc Af Amer: 87 mL/min — ABNORMAL LOW (ref >90)
GFR, EST NON AFRICAN AMERICAN: 75 mL/min — AB (ref >90)
Glucose, Bld: 86 mg/dL (ref 70–99)
POTASSIUM: 3.5 meq/L — AB (ref 3.7–5.3)
SODIUM: 142 meq/L (ref 137–147)
TOTAL PROTEIN: 5.4 g/dL — AB (ref 6.0–8.3)
Total Bilirubin: 0.7 mg/dL (ref 0.3–1.2)

## 2014-01-12 LAB — TSH: TSH: 4.31 u[IU]/mL (ref 0.350–4.500)

## 2014-01-12 LAB — MAGNESIUM: Magnesium: 1.4 mg/dL — ABNORMAL LOW (ref 1.5–2.5)

## 2014-01-12 LAB — CLOSTRIDIUM DIFFICILE BY PCR: CDIFFPCR: NEGATIVE

## 2014-01-12 LAB — PHOSPHORUS: Phosphorus: 2.5 mg/dL (ref 2.3–4.6)

## 2014-01-12 MED ORDER — TERAZOSIN HCL 2 MG PO CAPS
2.0000 mg | ORAL_CAPSULE | Freq: Every day | ORAL | Status: DC
  Administered 2014-01-12: 2 mg via ORAL
  Filled 2014-01-12 (×2): qty 1

## 2014-01-12 MED ORDER — PANTOPRAZOLE SODIUM 40 MG PO TBEC
40.0000 mg | DELAYED_RELEASE_TABLET | ORAL | Status: DC
  Administered 2014-01-12 – 2014-01-14 (×2): 40 mg via ORAL
  Filled 2014-01-12 (×2): qty 1

## 2014-01-12 MED ORDER — FUROSEMIDE 10 MG/ML IJ SOLN
20.0000 mg | Freq: Every day | INTRAMUSCULAR | Status: DC
  Administered 2014-01-12 – 2014-01-14 (×3): 20 mg via INTRAVENOUS
  Filled 2014-01-12 (×3): qty 2

## 2014-01-12 MED ORDER — LORAZEPAM 2 MG/ML IJ SOLN
1.0000 mg | Freq: Every evening | INTRAMUSCULAR | Status: DC | PRN
Start: 2014-01-12 — End: 2014-01-15
  Administered 2014-01-12 – 2014-01-14 (×3): 1 mg via INTRAVENOUS
  Filled 2014-01-12 (×3): qty 1

## 2014-01-12 MED ORDER — CARVEDILOL 25 MG PO TABS
25.0000 mg | ORAL_TABLET | Freq: Two times a day (BID) | ORAL | Status: DC
  Administered 2014-01-12 – 2014-01-15 (×7): 25 mg via ORAL
  Filled 2014-01-12 (×9): qty 1

## 2014-01-12 MED ORDER — SODIUM CHLORIDE 0.9 % IJ SOLN
3.0000 mL | INTRAMUSCULAR | Status: DC | PRN
  Administered 2014-01-14: 3 mL via INTRAVENOUS

## 2014-01-12 MED ORDER — SODIUM CHLORIDE 0.9 % IJ SOLN
3.0000 mL | Freq: Two times a day (BID) | INTRAMUSCULAR | Status: DC
  Administered 2014-01-13 – 2014-01-14 (×4): 3 mL via INTRAVENOUS

## 2014-01-12 MED ORDER — METRONIDAZOLE IN NACL 5-0.79 MG/ML-% IV SOLN
500.0000 mg | Freq: Three times a day (TID) | INTRAVENOUS | Status: DC
  Administered 2014-01-12 – 2014-01-14 (×6): 500 mg via INTRAVENOUS
  Filled 2014-01-12 (×7): qty 100

## 2014-01-12 MED ORDER — LORATADINE 10 MG PO TABS
10.0000 mg | ORAL_TABLET | Freq: Every evening | ORAL | Status: DC
  Administered 2014-01-12 – 2014-01-14 (×3): 10 mg via ORAL
  Filled 2014-01-12 (×4): qty 1

## 2014-01-12 MED ORDER — POTASSIUM CHLORIDE CRYS ER 20 MEQ PO TBCR
40.0000 meq | EXTENDED_RELEASE_TABLET | Freq: Once | ORAL | Status: AC
  Administered 2014-01-12: 40 meq via ORAL
  Filled 2014-01-12: qty 2

## 2014-01-12 MED ORDER — ACETAMINOPHEN 500 MG PO TABS
500.0000 mg | ORAL_TABLET | ORAL | Status: DC | PRN
Start: 2014-01-12 — End: 2014-01-15

## 2014-01-12 MED ORDER — CIPROFLOXACIN IN D5W 400 MG/200ML IV SOLN
400.0000 mg | Freq: Two times a day (BID) | INTRAVENOUS | Status: DC
Start: 2014-01-12 — End: 2014-01-14
  Administered 2014-01-12 – 2014-01-14 (×5): 400 mg via INTRAVENOUS
  Filled 2014-01-12 (×6): qty 200

## 2014-01-12 MED ORDER — FUROSEMIDE 10 MG/ML IJ SOLN
INTRAMUSCULAR | Status: AC
  Administered 2014-01-12: 10 mg
  Filled 2014-01-12: qty 4

## 2014-01-12 MED ORDER — ENOXAPARIN SODIUM 40 MG/0.4ML ~~LOC~~ SOLN
40.0000 mg | SUBCUTANEOUS | Status: DC
  Administered 2014-01-12 – 2014-01-15 (×4): 40 mg via SUBCUTANEOUS
  Filled 2014-01-12 (×4): qty 0.4

## 2014-01-12 MED ORDER — RISAQUAD PO CAPS
1.0000 | ORAL_CAPSULE | Freq: Every day | ORAL | Status: DC
  Administered 2014-01-12 – 2014-01-15 (×4): 1 via ORAL
  Filled 2014-01-12 (×4): qty 1

## 2014-01-12 MED ORDER — VITAMIN B-1 100 MG PO TABS
100.0000 mg | ORAL_TABLET | Freq: Every day | ORAL | Status: DC
  Administered 2014-01-12 – 2014-01-15 (×4): 100 mg via ORAL
  Filled 2014-01-12 (×4): qty 1

## 2014-01-12 MED ORDER — ONDANSETRON HCL 4 MG/2ML IJ SOLN
4.0000 mg | Freq: Four times a day (QID) | INTRAMUSCULAR | Status: DC | PRN
Start: 2014-01-12 — End: 2014-01-15
  Administered 2014-01-12 – 2014-01-14 (×4): 4 mg via INTRAVENOUS
  Filled 2014-01-12 (×4): qty 2

## 2014-01-12 MED ORDER — ACETAMINOPHEN 325 MG PO TABS: 650.0000 mg | ORAL_TABLET | Freq: Four times a day (QID) | ORAL | Status: DC | PRN

## 2014-01-12 MED ORDER — ONDANSETRON HCL 4 MG PO TABS
4.0000 mg | ORAL_TABLET | Freq: Four times a day (QID) | ORAL | Status: DC | PRN
  Administered 2014-01-14 – 2014-01-15 (×2): 4 mg via ORAL
  Filled 2014-01-12 (×2): qty 1

## 2014-01-12 MED ORDER — ACETAMINOPHEN 650 MG RE SUPP: 650.0000 mg | Freq: Four times a day (QID) | RECTAL | Status: DC | PRN

## 2014-01-12 MED ORDER — FOLIC ACID 1 MG PO TABS
1.0000 mg | ORAL_TABLET | Freq: Every day | ORAL | Status: DC
  Administered 2014-01-12 – 2014-01-15 (×4): 1 mg via ORAL
  Filled 2014-01-12 (×4): qty 1

## 2014-01-12 MED ORDER — HYDROCODONE-ACETAMINOPHEN 5-325 MG PO TABS
1.0000 | ORAL_TABLET | ORAL | Status: DC | PRN
  Administered 2014-01-12: 1 via ORAL
  Administered 2014-01-13: 2 via ORAL
  Filled 2014-01-12: qty 1
  Filled 2014-01-12: qty 2

## 2014-01-12 MED ORDER — SODIUM CHLORIDE 0.9 % IV SOLN: 250.0000 mL | INTRAVENOUS | Status: DC | PRN

## 2014-01-12 MED ORDER — SODIUM CHLORIDE 0.9 % IJ SOLN
3.0000 mL | Freq: Two times a day (BID) | INTRAMUSCULAR | Status: DC
  Administered 2014-01-13 – 2014-01-14 (×2): 3 mL via INTRAVENOUS

## 2014-01-12 MED ORDER — MAGNESIUM SULFATE 40 MG/ML IJ SOLN
2.0000 g | Freq: Once | INTRAMUSCULAR | Status: AC
  Administered 2014-01-12: 2 g via INTRAVENOUS
  Filled 2014-01-12: qty 50

## 2014-01-12 MED ORDER — ASPIRIN EC 325 MG PO TBEC
325.0000 mg | DELAYED_RELEASE_TABLET | ORAL | Status: DC
  Administered 2014-01-12 – 2014-01-14 (×2): 325 mg via ORAL
  Filled 2014-01-12 (×2): qty 1

## 2014-01-12 MED ORDER — SALINE SPRAY 0.65 % NA SOLN: 1.0000 | NASAL | Status: DC | PRN

## 2014-01-12 MED ORDER — LISINOPRIL 10 MG PO TABS
10.0000 mg | ORAL_TABLET | Freq: Every day | ORAL | Status: DC
  Administered 2014-01-12 – 2014-01-15 (×4): 10 mg via ORAL
  Filled 2014-01-12 (×4): qty 1

## 2014-01-12 MED ORDER — METRONIDAZOLE IN NACL 5-0.79 MG/ML-% IV SOLN
500.0000 mg | Freq: Once | INTRAVENOUS | Status: AC
  Administered 2014-01-12: 500 mg via INTRAVENOUS
  Filled 2014-01-12: qty 100

## 2014-01-12 NOTE — H&P (Signed)
PCP:  Ethan Boys, MD    Chief Complaint:  Diarrhea and generalized weakness  HPI: Ethan Mclean is a 80 y.o. male   has a past medical history of CHF (congestive heart failure); DVT (deep venous thrombosis); Complication of anesthesia; Type II diabetes mellitus; GERD (gastroesophageal reflux disease); and Gout.   Presented with  3 week hx of persistent diarrhea. Patient continued to get progressively weaker today he got on a comode and could not get up. 911 was called and he was taker to ER. CT scan of abdomen was done showing possible colitis. Patient reports 4-5 BM a day. Reports chills but no fever. Per review of records he has hc of cardiomyopathy with EF of 45-55% followed by [name]. And hx of IVF placed for hx of large DVT in 2009  Review of Systems:     Pertinent positives include:  chills, diarrhea, nausea,  fatigue, weight loss 10 lb over several years  Constitutional:  No weight loss, night sweats, Fevers,,  HEENT:  No headaches, Difficulty swallowing,Tooth/dental problems,Sore throat,  No sneezing, itching, ear ache, nasal congestion, post nasal drip,  Cardio-vascular:  No chest pain, Orthopnea, PND, anasarca, dizziness, palpitations.no Bilateral lower extremity swelling  GI:  No heartburn, indigestion, abdominal pain,  vomitingchange in bowel habits, loss of appetite, melena, blood in stool, hematemesis Resp:  no shortness of breath at rest. No dyspnea on exertion, No excess mucus, no productive cough, No non-productive cough, No coughing up of blood.No change in color of mucus.No wheezing. Skin:  no rash or lesions. No jaundice GU:  no dysuria, change in color of urine, no urgency or frequency. No straining to urinate.  No flank pain.  Musculoskeletal:  No joint pain or no joint swelling. No decreased range of motion. No back pain.  Psych:  No change in mood or affect. No depression or anxiety. No memory loss.  Neuro: no localizing neurological complaints,  no tingling, no weakness, no double vision, no gait abnormality, no slurred speech, no confusion  Otherwise ROS are negative except for above, 10 systems were reviewed  Past Medical History: Past Medical History  Diagnosis Date  . CHF (congestive heart failure)   . DVT (deep venous thrombosis)     "legs"  . Complication of anesthesia     "slow to wake up after eye OR"  . Type II diabetes mellitus     "dx'd years ago; don't have it anymroe" (01/11/2014)  . GERD (gastroesophageal reflux disease)   . Gout    Past Surgical History  Procedure Laterality Date  . Appendectomy    . Vena cava filter placement    . Cataract extraction w/ intraocular lens implant Left   . Tonsillectomy    . Cardiac catheterization  12/2001  . Eye surgery Bilateral     "to retain tears"     Medications: Prior to Admission medications   Medication Sig Start Date End Date Taking? Authorizing Provider  acetaminophen (TYLENOL) 500 MG tablet Take 500 mg by mouth every other day as needed for mild pain.   Yes Historical Provider, MD  aspirin EC 325 MG tablet Take 325 mg by mouth every other day.   Yes Historical Provider, MD  calcium carbonate (TUMS - DOSED IN MG ELEMENTAL CALCIUM) 500 MG chewable tablet Chew 250 mg by mouth every other day.   Yes Historical Provider, MD  carvedilol (COREG) 25 MG tablet Take 25 mg by mouth 2 (two) times daily with a meal.  Yes Historical Provider, MD  cholecalciferol (VITAMIN D) 1000 UNITS tablet Take 1,000 Units by mouth daily.   Yes Historical Provider, MD  furosemide (LASIX) 20 MG tablet Take 20 mg by mouth daily as needed for fluid.   Yes Historical Provider, MD  lisinopril (PRINIVIL,ZESTRIL) 20 MG tablet Take 10 mg by mouth daily.    Yes Historical Provider, MD  loperamide (IMODIUM) 2 MG capsule Take 2 mg by mouth daily as needed for diarrhea or loose stools.   Yes Historical Provider, MD  loratadine (CLARITIN) 10 MG tablet Take 10 mg by mouth every evening.   Yes  Historical Provider, MD  Omega-3 Fatty Acids (OMEGA 3 PO) Take 2 capsules by mouth 2 (two) times daily.   Yes Historical Provider, MD  omeprazole (PRILOSEC OTC) 20 MG tablet Take 20 mg by mouth every other day.   Yes Historical Provider, MD  oxymetazoline (AFRIN) 0.05 % nasal spray Place 1 spray into both nostrils 2 (two) times daily as needed for congestion.   Yes Historical Provider, MD  Polyethyl Glycol-Propyl Glycol (SYSTANE) 0.4-0.3 % SOLN Place 1 drop into both eyes 2 (two) times daily.   Yes Historical Provider, MD  Probiotic Product (PROBIOTIC COLON SUPPORT) CAPS Take 1 capsule by mouth daily.   Yes Historical Provider, MD  sodium chloride (OCEAN) 0.65 % SOLN nasal spray Place 1 spray into both nostrils as needed for congestion.   Yes Historical Provider, MD  terazosin (HYTRIN) 2 MG capsule Take 2 mg by mouth at bedtime.   Yes Historical Provider, MD    Allergies:   Allergies  Allergen Reactions  . Iohexol      Desc: contrast induced renal insufficiency in past, entered 12/25/2004 bsw     . Sulfonamide Derivatives     unknown    Social History:  [employer] forces technical officer Lives at   Home with wife   reports that he quit smoking about 62 years ago. His smoking use included Cigarettes. He smoked 0.00 packs per day for 5 years. He has never used smokeless tobacco. He reports that he drinks alcohol. He reports that he does not use illicit drugs.   Family History: family history includes Cancer in his other.    Physical Exam: Patient Vitals for the past 24 hrs:  BP Temp Temp src Pulse Resp SpO2 Height Weight  01/11/14 2121 135/70 mmHg 97.2 F (36.2 C) Oral 74 18 96 % - -  01/11/14 2106 - - - - - - 6\' 3"  (1.905 m) 90.1 kg (198 lb 10.2 oz)  01/11/14 2030 128/53 mmHg 98.3 F (36.8 C) Oral 74 16 95 % - -  01/11/14 1930 118/64 mmHg - - 73 19 98 % - -  01/11/14 1912 136/67 mmHg - - 70 18 96 % - -  01/11/14 1900 136/67 mmHg - - 104 17 95 % - -  01/11/14 1800 149/91 mmHg - - 90  14 91 % - -  01/11/14 1730 150/84 mmHg - - 71 19 98 % - -  01/11/14 1715 153/88 mmHg - - 80 18 - - -  01/11/14 1630 132/58 mmHg - - 81 17 98 % - -  01/11/14 1600 144/73 mmHg - - 82 14 97 % - -  01/11/14 1500 147/69 mmHg - - 79 20 99 % - -  01/11/14 1438 147/95 mmHg 97.8 F (36.6 C) Oral 72 18 - - -  01/11/14 1428 151/78 mmHg - - 80 27 97 % - -  1. General:  in No Acute distress 2. Psychological: Alert and   Oriented 3. Head/ENT:     Dry Mucous Membranes                          Head Non traumatic, neck supple                          Normal  Dentition 4. SKIN:   decreased Skin turgor,  Skin clean Dry and intact. Erythema over lower extremities 5. Heart: Regular rate and rhythm no Murmur, Rub or gallop 6. Lungs: Clear to auscultation bilaterally, no wheezes or crackles   7. Abdomen: Soft, non-tender, Non distended 8. Lower extremities: no clubbing, cyanosis, 3+ edema 9. Neurologically Grossly intact, moving all 4 extremities equally 10. MSK: Normal range of motion  body mass index is 24.83 kg/(m^2).   Labs on Admission:   Recent Labs  01/11/14 1444  NA 139  K 4.6  CL 104  CO2 22  GLUCOSE 94  BUN 25*  CREATININE 0.99  CALCIUM 8.5    Recent Labs  01/11/14 1444  AST 16  ALT 11  ALKPHOS 57  BILITOT 0.5  PROT 6.5  ALBUMIN 2.7*   No results found for this basename: LIPASE, AMYLASE,  in the last 72 hours  Recent Labs  01/11/14 1444  WBC 7.6  NEUTROABS 4.8  HGB 13.7  HCT 39.1  MCV 93.8  PLT 97*   No results found for this basename: CKTOTAL, CKMB, CKMBINDEX, TROPONINI,  in the last 72 hours No results found for this basename: TSH, T4TOTAL, FREET3, T3FREE, THYROIDAB,  in the last 72 hours No results found for this basename: VITAMINB12, FOLATE, FERRITIN, TIBC, IRON, RETICCTPCT,  in the last 72 hours No results found for this basename: HGBA1C    Estimated Creatinine Clearance: 65.2 ml/min (by C-G formula based on Cr of 0.99). ABG No results found for this  basename: phart, pco2, po2, hco3, tco2, acidbasedef, o2sat     No results found for this basename: DDIMER     Other results:  I have pearsonaly reviewed this: ECG REPORT  Rate 80  Rhythm: wandering atrial pacemaker ST&T Change: no ischemic changes  UA no evidence of UTI    Cultures: No results found for this basename: sdes, specrequest, cult, reptstatus       Radiological Exams on Admission: Ct Abdomen Pelvis Wo Contrast  01/11/2014   CLINICAL DATA:  Diarrhea, ileus, history diabetes, hypertension, CHF, colonic diverticulosis  EXAM: CT ABDOMEN AND PELVIS WITHOUT CONTRAST  TECHNIQUE: Multidetector CT imaging of the abdomen and pelvis was performed following the standard protocol without intravenous contrast. Sagittal and coronal MPR images reconstructed from axial data set. Patient drank dilute oral contrast for exam. IV contrast not administered due to history of Iohexol allergy.  COMPARISON:  CT abdomen 12/03/2003  FINDINGS: Small bibasilar pleural effusions and atelectasis.  Extensive atherosclerotic calcifications including coronary arteries.  IVC filter noted.  Within limits of a nonenhanced exam, no focal abnormalities of the liver, spleen, pancreas, or adrenal glands.  Bilateral renal cortical atrophy with a mid right renal cyst 3.8 x 2.5 cm image 40.  No urinary tract calcification or dilatation.  Small supraumbilical ventral hernia containing fat, fascial defect 13 x 12 mm.  Prostatic enlargement, gland 6.5 x 5.0 x 6 8 cm.  Unremarkable bladder and ureters.  Sigmoid diverticulosis without evidence of diverticulitis.  Appendix surgically absent by  history.  Nonspecific stranding identified at the inferior aspect of the right perinephric space and Zuckerkandl's fascia, nonspecific, with additional mild stranding seen within the presacral fat.  Question bowel wall thickening of ascending colon versus artifact from underdistention.  Stomach and remaining bowel loops unremarkable.  No  mass, adenopathy, ascites or free air.  Bones demineralized.  Bilateral fatty replacement of the gluteus minimus.  IMPRESSION: Small bibasilar pleural effusions and atelectasis.  Small supraumbilical ventral hernia containing fat.  Prostatic enlargement.  Sigmoid diverticulosis without evidence of diverticulitis.  Questionable wall thickening of the ascending colon versus artifact from underdistention; unable to exclude colonic neoplasm or colitis; recommend correlation with colonoscopy to exclude tumor.   Electronically Signed   By: Ulyses Southward M.D.   On: 01/11/2014 19:03   Dg Abd Acute W/chest  01/11/2014   CLINICAL DATA:  Diarrhea.  EXAM: ACUTE ABDOMEN SERIES (ABDOMEN 2 VIEW & CHEST 1 VIEW)  COMPARISON:  Two-view chest x-ray 06/22/2013  FINDINGS: The heart is enlarged. Atherosclerotic calcifications are again seen in the aortic. The lungs are clear.  Fluid levels are present on the upright images without significant bowel dilation. There is no free air. An IVC filter is in place. Degenerative changes are noted in the lower lumbar spine.  IMPRESSION: 1. No acute cardiopulmonary disease. 2. Fluid levels without dilated bowel. This may represent a diffuse ileus.   Electronically Signed   By: Gennette Pac M.D.   On: 01/11/2014 15:58    Chart has been reviewed  Assessment/Plan  80 yo M with hx of chronic peripheral edema due to hx of IVC filter now with 3 wk hx of diarrhea and questionable colitis  on CT  Present on Admission:  . Ileus vs colitis on CT will admit on bowel rest, obtain stool studies, given prolonged diarrhea would benefit from GI consult.  Marland Kitchen HYPERTENSION - continue home medications . CHF - avoid fluid overload, restart lasix as needed . 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 Nonspecific abnormal electrocardiogram (ECG) (EKG) - repeat ECG obtain Echo given hx of CHF, cycle CE   Prophylaxis: lovenox, Protonix  CODE STATUS: FULL CODE  Other plan  as per orders.  I have spent a total of 55 min on this admission  Nickol Collister 01/12/2014, 1:03 AM

## 2014-01-12 NOTE — Progress Notes (Signed)
Patient ID: Ethan Mclean, male   DOB: 1926/05/29, 80 y.o.   MRN: [id number]  [facility] PROGRESS NOTE  SMYAN EHRMAN TGP:[id number] DOB: 1926/06/28 DOA: 01/11/2014 PCP: [name], MD  Brief narrative: Pt is 80 yo male with CHF and EF 45% (follows with [name]), DVT (had IVC filter placed), DM, HTN, presented to [facility] ED with main concern of 3 weeks duration of watery diarrhea associated with poor oral intake, fatigue, weakness. CT scan of abdomen was done showing possible colitis.  Active Problems:   Diarrhea with ileus  - etiology unclear, possibly related to colitis - continue Cipro and Flagyl day #2 - follow upon stool studies, stool panel by PCR, O&P, stool culture - GI consult for further recommendations    Hypokalemia and hypomagnesemia - secondary to diarrhea - will supplement and repeat BMP in AM - check Mg also in AM   HYPERTENSION - reasonable inpatient control   CHF, systolic - received IVF on admission but is on Lasix at home - stop IVF now and advance diet - place back on Lasix home regimen - 2 D ECHO ordered    Acute urinary retention - secondary to BPH - in/out cath and may need foley prior to discharge    S/P IVC filter  Consultants:  GI  Procedures/Studies: Ct Abdomen Pelvis Wo Contrast  01/11/2014   Small bibasilar pleural effusions and atelectasis.  Small supraumbilical ventral hernia containing fat.  Prostatic enlargement.  Sigmoid diverticulosis without evidence of diverticulitis.  Questionable wall thickening of the ascending colon versus artifact from underdistention; unable to exclude colonic neoplasm or colitis; recommend correlation with colonoscopy to exclude tumor.    Dg Abd Acute W/chest  01/11/2014  No acute cardiopulmonary disease.  Fluid levels without dilated bowel. This may represent a diffuse ileus.    Antibiotics:  Cipro 01/11/2014 -->  Flagyl 4/24 -->   Code Status: Full Family Communication: Pt at bedside Disposition  Plan: Home when medically stable  HPI/Subjective: No events overnight.   Objective: Filed Vitals:   01/12/14 0304 01/12/14 0602 01/12/14 0922 01/12/14 1500  BP: 127/82 118/68 118/75 143/90  Pulse:  73 90 76  Temp:  98.2 F (36.8 C) 98.4 F (36.9 C) 98.5 F (36.9 C)  TempSrc:  Oral Oral Oral  Resp:  20 18 18   Height:      Weight:      SpO2:  96% 94% 95%    Intake/Output Summary (Last 24 hours) at 01/12/14 1605 Last data filed at 01/12/14 1552  Gross per 24 hour  Intake      0 ml  Output    300 ml  Net   -300 ml    Exam:   General:  Pt is alert, follows commands appropriately, not in acute distress  Cardiovascular: Regular rate and rhythm,  no rubs, no gallops  Respiratory: Clear to auscultation bilaterally, no wheezing, no crackles, no rhonchi  Abdomen: Soft, slightly tender in epigastric area, non distended, bowel sounds soft, no guarding  Extremities: +1 bilateral LE pitting edema, pulses DP and PT palpable bilaterally  Neuro: Grossly nonfocal  Data Reviewed: Basic Metabolic Panel:  Recent Labs Lab 01/11/14 1444 01/12/14 0358  NA 139 142  K 4.6 3.5*  CL 104 109  CO2 22 20  GLUCOSE 94 86  BUN 25* 21  CREATININE 0.99 0.90  CALCIUM 8.5 8.2*  MG  --  1.4*  PHOS  --  2.5   Liver Function Tests:  Recent Labs  Lab 01/11/14 1444 01/12/14 0358  AST 16 13  ALT 11 9  ALKPHOS 57 47  BILITOT 0.5 0.7  PROT 6.5 5.4*  ALBUMIN 2.7* 2.2*   CBC:  Recent Labs Lab 01/11/14 1444 01/12/14 0358  WBC 7.6 5.6  NEUTROABS 4.8  --   HGB 13.7 12.1*  HCT 39.1 35.0*  MCV 93.8 93.6  PLT 97* 85*   Cardiac Enzymes:  Recent Labs Lab 01/12/14 0358 01/12/14 0811  TROPONINI <0.30 <0.30    Recent Results (from the past 240 hour(s))  CLOSTRIDIUM DIFFICILE BY PCR     Status: None   Collection Time    01/11/14  6:36 PM      Result Value Ref Range Status   C difficile by pcr NEGATIVE  NEGATIVE Final  STOOL CULTURE     Status: None   Collection Time     01/11/14  6:36 PM      Result Value Ref Range Status   Specimen Description STOOL   Final   Special Requests NONE   Final   Culture     Final   Value: Culture reincubated for better growth     Performed at [facility]   Report Status PENDING   Incomplete     Scheduled Meds: . acidophilus  1 capsule Oral Daily  . aspirin EC  325 mg Oral QODAY  . carvedilol  25 mg Oral BID WC  . ciprofloxacin  400 mg Intravenous Q12H  . enoxaparin (LOVENOX) injection  40 mg Subcutaneous Q24H  . folic acid  1 mg Oral Daily  . furosemide  20 mg Intravenous Daily  . lisinopril  10 mg Oral Daily  . loratadine  10 mg Oral QPM  . magnesium sulfate 1 - 4 g bolus IVPB  2 g Intravenous Once  . metronidazole  500 mg Intravenous Q8H  . pantoprazole  40 mg Oral QODAY  . sodium chloride  3 mL Intravenous Q12H  . sodium chloride  3 mL Intravenous Q12H  . thiamine  100 mg Oral Daily   Continuous Infusions:    Dorothea OgleIskra M Myers, MD  [facility] Pager [phone]  If 7PM-7AM, please contact night-coverage [email or web address] Password [email or web address] 01/12/2014, 4:05 PM   LOS: 1 day

## 2014-01-12 NOTE — Progress Notes (Signed)
  Pt admitted to the unit. Pt is stable, alert and oriented per baseline. Oriented to room, staff, and call bell. Educated to call for any assistance. Bed in lowest position, call bell within reach- will continue to monitor. 

## 2014-01-13 ENCOUNTER — Encounter (HOSPITAL_COMMUNITY): Payer: Self-pay | Admitting: Gastroenterology

## 2014-01-13 DIAGNOSIS — R609 Edema, unspecified: Secondary | ICD-10-CM

## 2014-01-13 LAB — BASIC METABOLIC PANEL
BUN: 19 mg/dL (ref 6–23)
CHLORIDE: 105 meq/L (ref 96–112)
CO2: 21 mEq/L (ref 19–32)
Calcium: 8.1 mg/dL — ABNORMAL LOW (ref 8.4–10.5)
Creatinine, Ser: 0.98 mg/dL (ref 0.50–1.35)
GFR, EST AFRICAN AMERICAN: 84 mL/min — AB (ref >90)
GFR, EST NON AFRICAN AMERICAN: 73 mL/min — AB (ref >90)
Glucose, Bld: 92 mg/dL (ref 70–99)
POTASSIUM: 4.2 meq/L (ref 3.7–5.3)
Sodium: 137 mEq/L (ref 137–147)

## 2014-01-13 LAB — CBC
HEMATOCRIT: 36.7 % — AB (ref 39.0–52.0)
Hemoglobin: 12.5 g/dL — ABNORMAL LOW (ref 13.0–17.0)
MCH: 32.3 pg (ref 26.0–34.0)
MCHC: 34.1 g/dL (ref 30.0–36.0)
MCV: 94.8 fL (ref 78.0–100.0)
PLATELETS: 81 10*3/uL — AB (ref 150–400)
RBC: 3.87 MIL/uL — ABNORMAL LOW (ref 4.22–5.81)
RDW: 13.6 % (ref 11.5–15.5)
WBC: 6.4 10*3/uL (ref 4.0–10.5)

## 2014-01-13 LAB — MAGNESIUM: MAGNESIUM: 1.6 mg/dL (ref 1.5–2.5)

## 2014-01-13 MED ORDER — WHITE PETROLATUM GEL
Status: AC
  Administered 2014-01-13: 0.2
  Filled 2014-01-13: qty 5

## 2014-01-13 NOTE — Progress Notes (Addendum)
Patient ID: Ethan Mclean, male   DOB: 03-Jun-1926, 80 y.o.   MRN: [id number]  [facility] PROGRESS NOTE  AIDRIAN CUFFE WYS:[id number] DOB: 1925/10/18 DOA: 01/11/2014 PCP: [name], MD  Brief narrative:  Pt is 81 yo male with CHF and EF 45% (follows with [name]), DVT (had IVC filter placed), DM, HTN, presented to [facility] ED with main concern of 3 weeks duration of watery diarrhea associated with poor oral intake, fatigue, weakness. CT scan of abdomen was done showing possible colitis.   Active Problems:  Diarrhea with ileus  - etiology unclear, possibly related to colitis  - continue Cipro and Flagyl day #3 - follow upon stool studies, stool panel by PCR, O&P, stool culture  - GI consult for further recommendations  Hypokalemia and hypomagnesemia  - secondary to diarrhea  - supplemented and WNL - Mg also WNL HYPERTENSION  - reasonable inpatient control  CHF, systolic  - received IVF on admission but is on Lasix at home  - place back on Lasix home regimen  - 2 D ECHO ordered, will follow up results   Acute urinary retention  - secondary to BPH  - in/out cath and may need foley prior to discharge  S/P IVC filter  Thrombocytopenia - chronic, at baseline 90 - 100 since 2012 - no signs of bleeding - CBC in AM  Consultants:  GI Procedures/Studies:  Ct Abdomen Pelvis Wo Contrast 01/11/2014 Small bibasilar pleural effusions and atelectasis. Small supraumbilical ventral hernia containing fat. Prostatic enlargement. Sigmoid diverticulosis without evidence of diverticulitis. Questionable wall thickening of the ascending colon versus artifact from underdistention; unable to exclude colonic neoplasm or colitis; recommend correlation with colonoscopy to exclude tumor.  Dg Abd Acute W/chest 01/11/2014 No acute cardiopulmonary disease. Fluid levels without dilated bowel. This may represent a diffuse ileus.  Antibiotics:  Cipro 01/11/2014 -->  Flagyl 4/24 -->   Code Status: Full   Family Communication: Pt at bedside  Disposition Plan: PT evaluation   HPI/Subjective: No events overnight.   Objective: Filed Vitals:   01/12/14 1500 01/12/14 2038 01/13/14 0218 01/13/14 0646  BP: 143/90 120/76 105/71 136/89  Pulse: 76 78 52 75  Temp: 98.5 F (36.9 C) 97.4 F (36.3 C) 97.5 F (36.4 C) 97.4 F (36.3 C)  TempSrc: Oral Oral Oral Oral  Resp: 18 18 18 18   Height:      Weight:    92.216 kg (203 lb 4.8 oz)  SpO2: 95% 92% 94% 96%    Intake/Output Summary (Last 24 hours) at 01/13/14 1043 Last data filed at 01/13/14 0813  Gross per 24 hour  Intake    550 ml  Output   2275 ml  Net  -1725 ml    Exam:   General:  Pt is alert, follows commands appropriately, not in acute distress  Cardiovascular: Regular rate and rhythm, S1/S2, no murmurs, no rubs, no gallops  Respiratory: Clear to auscultation bilaterally, no wheezing, no crackles, no rhonchi  Abdomen: Soft, non tender, non distended, bowel sounds present, no guarding  Data Reviewed: Basic Metabolic Panel:  Recent Labs Lab 01/11/14 1444 01/12/14 0358 01/13/14 0659  NA 139 142 137  K 4.6 3.5* 4.2  CL 104 109 105  CO2 22 20 21   GLUCOSE 94 86 92  BUN 25* 21 19  CREATININE 0.99 0.90 0.98  CALCIUM 8.5 8.2* 8.1*  MG  --  1.4* 1.6  PHOS  --  2.5  --    Liver Function Tests:  Recent  Labs Lab 01/11/14 1444 01/12/14 0358  AST 16 13  ALT 11 9  ALKPHOS 57 47  BILITOT 0.5 0.7  PROT 6.5 5.4*  ALBUMIN 2.7* 2.2*   CBC:  Recent Labs Lab 01/11/14 1444 01/12/14 0358 01/13/14 0659  WBC 7.6 5.6 6.4  NEUTROABS 4.8  --   --   HGB 13.7 12.1* 12.5*  HCT 39.1 35.0* 36.7*  MCV 93.8 93.6 94.8  PLT 97* 85* 81*   Cardiac Enzymes:  Recent Labs Lab 01/12/14 0358 01/12/14 0811  TROPONINI <0.30 <0.30     Recent Results (from the past 240 hour(s))  CLOSTRIDIUM DIFFICILE BY PCR     Status: None   Collection Time    01/11/14  6:36 PM      Result Value Ref Range Status   C difficile by pcr  NEGATIVE  NEGATIVE Final  STOOL CULTURE     Status: None   Collection Time    01/11/14  6:36 PM      Result Value Ref Range Status   Specimen Description STOOL   Final   Special Requests NONE   Final   Culture     Final   Value: Culture reincubated for better growth     Performed at [facility]   Report Status PENDING   Incomplete     Scheduled Meds: . acidophilus  1 capsule Oral Daily  . aspirin EC  325 mg Oral QODAY  . carvedilol  25 mg Oral BID WC  . ciprofloxacin  400 mg Intravenous Q12H  . enoxaparin (LOVENOX) injection  40 mg Subcutaneous Q24H  . folic acid  1 mg Oral Daily  . furosemide  20 mg Intravenous Daily  . lisinopril  10 mg Oral Daily  . loratadine  10 mg Oral QPM  . metronidazole  500 mg Intravenous Q8H  . pantoprazole  40 mg Oral QODAY  . sodium chloride  3 mL Intravenous Q12H  . sodium chloride  3 mL Intravenous Q12H  . thiamine  100 mg Oral Daily   Continuous Infusions:    Dorothea OgleIskra M Myers, MD  [facility] Pager [phone]  If 7PM-7AM, please contact night-coverage [email or web address] Password [email or web address] 01/13/2014, 10:43 AM   LOS: 2 days

## 2014-01-13 NOTE — Consult Note (Signed)
Reason for Consult: Abnormal CAT scan Referring Physician: Hospital team  Ethan Mclean is an 80 y.o. male.  HPI: Patient seen and examined and his case was discussed with the hospital team as well as the family and his office computer chart and hospital computer chart was reviewed and currently he is doing better and tolerating regular food and his bowels seem to be firmed up and we reviewed his last colonoscopy in 2000 and other than this few week illness he has not had much lower bowel problems and his previous endoscopy and barium swallow were reviewed as well as his CT and he has no other complaints although he has not been able to walk for a few years  Past Medical History  Diagnosis Date  . CHF (congestive heart failure)   . DVT (deep venous thrombosis)     "legs"  . Complication of anesthesia     "slow to wake up after eye OR"  . Type II diabetes mellitus     "dx'd years ago; don't have it anymroe" (01/11/2014)  . GERD (gastroesophageal reflux disease)   . Gout     Past Surgical History  Procedure Laterality Date  . Appendectomy    . Vena cava filter placement    . Cataract extraction w/ intraocular lens implant Left   . Tonsillectomy    . Cardiac catheterization  12/2001  . Eye surgery Bilateral     "to retain tears"    Family History  Problem Relation Age of Onset  . Cancer Other     Social History:  reports that he quit smoking about 62 years ago. His smoking use included Cigarettes. He smoked 0.00 packs per day for 5 years. He has never used smokeless tobacco. He reports that he drinks alcohol. He reports that he does not use illicit drugs.  Allergies:  Allergies  Allergen Reactions  . Iohexol      Desc: contrast induced renal insufficiency in past, entered 12/25/2004 bsw     . Sulfonamide Derivatives     unknown    Medications: I have reviewed the patient's current medications.  Results for orders placed during the hospital encounter of 01/11/14 (from the  past 48 hour(s))  CBC WITH DIFFERENTIAL     Status: Abnormal   Collection Time    01/11/14  2:44 PM      Result Value Ref Range   WBC 7.6  4.0 - 10.5 K/uL   RBC 4.17 (*) 4.22 - 5.81 MIL/uL   Hemoglobin 13.7  13.0 - 17.0 g/dL   HCT 39.1  39.0 - 52.0 %   MCV 93.8  78.0 - 100.0 fL   MCH 32.9  26.0 - 34.0 pg   MCHC 35.0  30.0 - 36.0 g/dL   RDW 13.6  11.5 - 15.5 %   Platelets 97 (*) 150 - 400 K/uL   Comment: REPEATED TO VERIFY     PLATELET COUNT CONFIRMED BY SMEAR   Neutrophils Relative % 63  43 - 77 %   Neutro Abs 4.8  1.7 - 7.7 K/uL   Lymphocytes Relative 25  12 - 46 %   Lymphs Abs 1.9  0.7 - 4.0 K/uL   Monocytes Relative 11  3 - 12 %   Monocytes Absolute 0.8  0.1 - 1.0 K/uL   Eosinophils Relative 1  0 - 5 %   Eosinophils Absolute 0.1  0.0 - 0.7 K/uL   Basophils Relative 0  0 - 1 %   Basophils  Absolute 0.0  0.0 - 0.1 K/uL  COMPREHENSIVE METABOLIC PANEL     Status: Abnormal   Collection Time    01/11/14  2:44 PM      Result Value Ref Range   Sodium 139  137 - 147 mEq/L   Potassium 4.6  3.7 - 5.3 mEq/L   Chloride 104  96 - 112 mEq/L   CO2 22  19 - 32 mEq/L   Glucose, Bld 94  70 - 99 mg/dL   BUN 25 (*) 6 - 23 mg/dL   Creatinine, Ser 0.99  0.50 - 1.35 mg/dL   Calcium 8.5  8.4 - 10.5 mg/dL   Total Protein 6.5  6.0 - 8.3 g/dL   Albumin 2.7 (*) 3.5 - 5.2 g/dL   AST 16  0 - 37 U/L   ALT 11  0 - 53 U/L   Alkaline Phosphatase 57  39 - 117 U/L   Total Bilirubin 0.5  0.3 - 1.2 mg/dL   GFR calc non Af Amer 73 (*) >90 mL/min   GFR calc Af Amer 84 (*) >90 mL/min   Comment: (NOTE)     The eGFR has been calculated using the CKD EPI equation.     This calculation has not been validated in all clinical situations.     eGFR's persistently <90 mL/min signify possible Chronic Kidney     Disease.  PROTIME-INR     Status: Abnormal   Collection Time    01/11/14  2:44 PM      Result Value Ref Range   Prothrombin Time 15.8 (*) 11.6 - 15.2 seconds   INR 1.29  0.00 - 1.49  PRO B NATRIURETIC  PEPTIDE     Status: Abnormal   Collection Time    01/11/14  2:44 PM      Result Value Ref Range   Pro B Natriuretic peptide (BNP) 9946.0 (*) 0 - 450 pg/mL  I-STAT TROPOININ, ED     Status: None   Collection Time    01/11/14  2:51 PM      Result Value Ref Range   Troponin i, poc 0.03  0.00 - 0.08 ng/mL   Comment 3            Comment: Due to the release kinetics of cTnI,     a negative result within the first hours     of the onset of symptoms does not rule out     myocardial infarction with certainty.     If myocardial infarction is still suspected,     repeat the test at appropriate intervals.  URINALYSIS, ROUTINE W REFLEX MICROSCOPIC     Status: Abnormal   Collection Time    01/11/14  5:20 PM      Result Value Ref Range   Color, Urine YELLOW  YELLOW   APPearance CLEAR  CLEAR   Specific Gravity, Urine 1.024  1.005 - 1.030   pH 5.0  5.0 - 8.0   Glucose, UA NEGATIVE  NEGATIVE mg/dL   Hgb urine dipstick MODERATE (*) NEGATIVE   Bilirubin Urine NEGATIVE  NEGATIVE   Ketones, ur NEGATIVE  NEGATIVE mg/dL   Protein, ur 30 (*) NEGATIVE mg/dL   Urobilinogen, UA 0.2  0.0 - 1.0 mg/dL   Nitrite NEGATIVE  NEGATIVE   Leukocytes, UA NEGATIVE  NEGATIVE  URINE MICROSCOPIC-ADD ON     Status: Abnormal   Collection Time    01/11/14  5:20 PM      Result Value Ref Range  Squamous Epithelial / LPF RARE  RARE   RBC / HPF 7-10  <3 RBC/hpf   Bacteria, UA RARE  RARE   Casts HYALINE CASTS (*) NEGATIVE   Urine-Other MUCOUS PRESENT    CLOSTRIDIUM DIFFICILE BY PCR     Status: None   Collection Time    01/11/14  6:36 PM      Result Value Ref Range   C difficile by pcr NEGATIVE  NEGATIVE  STOOL CULTURE     Status: None   Collection Time    01/11/14  6:36 PM      Result Value Ref Range   Specimen Description STOOL     Special Requests NONE     Culture       Value: NO SUSPICIOUS COLONIES, CONTINUING TO HOLD     Performed at [facility]   Report Status PENDING    MAGNESIUM     Status:  Abnormal   Collection Time    01/12/14  3:58 AM      Result Value Ref Range   Magnesium 1.4 (*) 1.5 - 2.5 mg/dL  PHOSPHORUS     Status: None   Collection Time    01/12/14  3:58 AM      Result Value Ref Range   Phosphorus 2.5  2.3 - 4.6 mg/dL  TSH     Status: None   Collection Time    01/12/14  3:58 AM      Result Value Ref Range   TSH 4.310  0.350 - 4.500 uIU/mL   Comment: Please note change in reference range.  COMPREHENSIVE METABOLIC PANEL     Status: Abnormal   Collection Time    01/12/14  3:58 AM      Result Value Ref Range   Sodium 142  137 - 147 mEq/L   Potassium 3.5 (*) 3.7 - 5.3 mEq/L   Comment: DELTA CHECK NOTED   Chloride 109  96 - 112 mEq/L   CO2 20  19 - 32 mEq/L   Glucose, Bld 86  70 - 99 mg/dL   BUN 21  6 - 23 mg/dL   Creatinine, Ser 0.90  0.50 - 1.35 mg/dL   Calcium 8.2 (*) 8.4 - 10.5 mg/dL   Total Protein 5.4 (*) 6.0 - 8.3 g/dL   Albumin 2.2 (*) 3.5 - 5.2 g/dL   AST 13  0 - 37 U/L   ALT 9  0 - 53 U/L   Alkaline Phosphatase 47  39 - 117 U/L   Total Bilirubin 0.7  0.3 - 1.2 mg/dL   GFR calc non Af Amer 75 (*) >90 mL/min   GFR calc Af Amer 87 (*) >90 mL/min   Comment: (NOTE)     The eGFR has been calculated using the CKD EPI equation.     This calculation has not been validated in all clinical situations.     eGFR's persistently <90 mL/min signify possible Chronic Kidney     Disease.  CBC     Status: Abnormal   Collection Time    01/12/14  3:58 AM      Result Value Ref Range   WBC 5.6  4.0 - 10.5 K/uL   RBC 3.74 (*) 4.22 - 5.81 MIL/uL   Hemoglobin 12.1 (*) 13.0 - 17.0 g/dL   HCT 35.0 (*) 39.0 - 52.0 %   MCV 93.6  78.0 - 100.0 fL   MCH 32.4  26.0 - 34.0 pg   MCHC 34.6  30.0 - 36.0  g/dL   RDW 13.6  11.5 - 15.5 %   Platelets 85 (*) 150 - 400 K/uL   Comment: CONSISTENT WITH PREVIOUS RESULT  TROPONIN I     Status: None   Collection Time    01/12/14  3:58 AM      Result Value Ref Range   Troponin I <0.30  <0.30 ng/mL   Comment:            Due to  the release kinetics of cTnI,     a negative result within the first hours     of the onset of symptoms does not rule out     myocardial infarction with certainty.     If myocardial infarction is still suspected,     repeat the test at appropriate intervals.  HEMOGLOBIN A1C     Status: Abnormal   Collection Time    01/12/14  3:58 AM      Result Value Ref Range   Hemoglobin A1C 5.9 (*) <5.7 %   Comment: (NOTE)                                                                               According to the ADA Clinical Practice Recommendations for 2011, when     HbA1c is used as a screening test:      >=6.5%   Diagnostic of Diabetes Mellitus               (if abnormal result is confirmed)     5.7-6.4%   Increased risk of developing Diabetes Mellitus     References:Diagnosis and Classification of Diabetes Mellitus,Diabetes     WVPX,1062,69(SWNIO 1):S62-S69 and Standards of Medical Care in             Diabetes - 2011,Diabetes Care,2011,34 (Suppl 1):S11-S61.   Mean Plasma Glucose 123 (*) <117 mg/dL   Comment: Performed at [facility]  TROPONIN I     Status: None   Collection Time    01/12/14  8:11 AM      Result Value Ref Range   Troponin I <0.30  <0.30 ng/mL   Comment:            Due to the release kinetics of cTnI,     a negative result within the first hours     of the onset of symptoms does not rule out     myocardial infarction with certainty.     If myocardial infarction is still suspected,     repeat the test at appropriate intervals.  CBC     Status: Abnormal   Collection Time    01/13/14  6:59 AM      Result Value Ref Range   WBC 6.4  4.0 - 10.5 K/uL   RBC 3.87 (*) 4.22 - 5.81 MIL/uL   Hemoglobin 12.5 (*) 13.0 - 17.0 g/dL   HCT 36.7 (*) 39.0 - 52.0 %   MCV 94.8  78.0 - 100.0 fL   MCH 32.3  26.0 - 34.0 pg   MCHC 34.1  30.0 - 36.0 g/dL   RDW 13.6  11.5 - 15.5 %   Platelets 81 (*) 150 - 400 K/uL   Comment:  CONSISTENT WITH PREVIOUS RESULT  BASIC METABOLIC PANEL      Status: Abnormal   Collection Time    01/13/14  6:59 AM      Result Value Ref Range   Sodium 137  137 - 147 mEq/L   Potassium 4.2  3.7 - 5.3 mEq/L   Chloride 105  96 - 112 mEq/L   CO2 21  19 - 32 mEq/L   Glucose, Bld 92  70 - 99 mg/dL   BUN 19  6 - 23 mg/dL   Creatinine, Ser 0.98  0.50 - 1.35 mg/dL   Calcium 8.1 (*) 8.4 - 10.5 mg/dL   GFR calc non Af Amer 73 (*) >90 mL/min   GFR calc Af Amer 84 (*) >90 mL/min   Comment: (NOTE)     The eGFR has been calculated using the CKD EPI equation.     This calculation has not been validated in all clinical situations.     eGFR's persistently <90 mL/min signify possible Chronic Kidney     Disease.  MAGNESIUM     Status: None   Collection Time    01/13/14  6:59 AM      Result Value Ref Range   Magnesium 1.6  1.5 - 2.5 mg/dL    Ct Abdomen Pelvis Wo Contrast  01/11/2014   CLINICAL DATA:  Diarrhea, ileus, history diabetes, hypertension, CHF, colonic diverticulosis  EXAM: CT ABDOMEN AND PELVIS WITHOUT CONTRAST  TECHNIQUE: Multidetector CT imaging of the abdomen and pelvis was performed following the standard protocol without intravenous contrast. Sagittal and coronal MPR images reconstructed from axial data set. Patient drank dilute oral contrast for exam. IV contrast not administered due to history of Iohexol allergy.  COMPARISON:  CT abdomen 12/03/2003  FINDINGS: Small bibasilar pleural effusions and atelectasis.  Extensive atherosclerotic calcifications including coronary arteries.  IVC filter noted.  Within limits of a nonenhanced exam, no focal abnormalities of the liver, spleen, pancreas, or adrenal glands.  Bilateral renal cortical atrophy with a mid right renal cyst 3.8 x 2.5 cm image 40.  No urinary tract calcification or dilatation.  Small supraumbilical ventral hernia containing fat, fascial defect 13 x 12 mm.  Prostatic enlargement, gland 6.5 x 5.0 x 6 8 cm.  Unremarkable bladder and ureters.  Sigmoid diverticulosis without evidence of  diverticulitis.  Appendix surgically absent by history.  Nonspecific stranding identified at the inferior aspect of the right perinephric space and Zuckerkandl's fascia, nonspecific, with additional mild stranding seen within the presacral fat.  Question bowel wall thickening of ascending colon versus artifact from underdistention.  Stomach and remaining bowel loops unremarkable.  No mass, adenopathy, ascites or free air.  Bones demineralized.  Bilateral fatty replacement of the gluteus minimus.  IMPRESSION: Small bibasilar pleural effusions and atelectasis.  Small supraumbilical ventral hernia containing fat.  Prostatic enlargement.  Sigmoid diverticulosis without evidence of diverticulitis.  Questionable wall thickening of the ascending colon versus artifact from underdistention; unable to exclude colonic neoplasm or colitis; recommend correlation with colonoscopy to exclude tumor.   Electronically Signed   By: Lavonia Dana M.D.   On: 01/11/2014 19:03   Dg Abd Acute W/chest  01/11/2014   CLINICAL DATA:  Diarrhea.  EXAM: ACUTE ABDOMEN SERIES (ABDOMEN 2 VIEW & CHEST 1 VIEW)  COMPARISON:  Two-view chest x-ray 06/22/2013  FINDINGS: The heart is enlarged. Atherosclerotic calcifications are again seen in the aortic. The lungs are clear.  Fluid levels are present on the upright images without significant bowel dilation. There is  no free air. An IVC filter is in place. Degenerative changes are noted in the lower lumbar spine.  IMPRESSION: 1. No acute cardiopulmonary disease. 2. Fluid levels without dilated bowel. This may represent a diffuse ileus.   Electronically Signed   By: Lawrence Santiago M.D.   On: 01/11/2014 15:58    ROS negative except above Blood pressure 136/89, pulse 75, temperature 97.4 F (36.3 C), temperature source Oral, resp. rate 18, height _0  (1.905 m), weight 92.216 kg (203 lb 4.8 oz), SpO2 96.00%. Physical Exam no acute distress vital signs stable afebrile exam pertinent for his abdomen being  soft nontender labs and CT reviewed stool studies pending  Assessment/Plan: Multiple medical problems in a seemingly resolved acute illness with abnormal CAT scan Plan: Can continue to treat medically and await stool panel and follow up with either myself or my partner Dr. Cristina Gong in a week or 2 to decide on colonoscopy versus a repeat CT in 2 months to document healing and the risks of colonoscopy was discussed and he and his family we'll continue to think about it and please call us back this hospital stay if we can be of any further assistance  Jeryl Columbia 01/13/2014, 12:49 PM

## 2014-01-13 NOTE — Progress Notes (Signed)
  Echocardiogram 2D Echocardiogram has been performed.  Ethan Mclean 01/13/2014, 2:52 PM

## 2014-01-13 NOTE — Progress Notes (Signed)
Patient stated to RN that he would not like to be turned every two hours. He said that he will let us know when he is uncomfortable and needs to be turned. Barrier cream has been placed on his bottom for protection. Will continue to monitor

## 2014-01-14 DIAGNOSIS — Z9889 Other specified postprocedural states: Secondary | ICD-10-CM

## 2014-01-14 DIAGNOSIS — I10 Essential (primary) hypertension: Secondary | ICD-10-CM

## 2014-01-14 LAB — CBC
HCT: 35.9 % — ABNORMAL LOW (ref 39.0–52.0)
Hemoglobin: 12.2 g/dL — ABNORMAL LOW (ref 13.0–17.0)
MCH: 31.9 pg (ref 26.0–34.0)
MCHC: 34 g/dL (ref 30.0–36.0)
MCV: 94 fL (ref 78.0–100.0)
PLATELETS: 92 10*3/uL — AB (ref 150–400)
RBC: 3.82 MIL/uL — ABNORMAL LOW (ref 4.22–5.81)
RDW: 13.6 % (ref 11.5–15.5)
WBC: 6.5 10*3/uL (ref 4.0–10.5)

## 2014-01-14 LAB — BASIC METABOLIC PANEL
BUN: 23 mg/dL (ref 6–23)
CO2: 20 mEq/L (ref 19–32)
Calcium: 8 mg/dL — ABNORMAL LOW (ref 8.4–10.5)
Chloride: 102 mEq/L (ref 96–112)
Creatinine, Ser: 1.16 mg/dL (ref 0.50–1.35)
GFR, EST AFRICAN AMERICAN: 64 mL/min — AB (ref >90)
GFR, EST NON AFRICAN AMERICAN: 56 mL/min — AB (ref >90)
Glucose, Bld: 131 mg/dL — ABNORMAL HIGH (ref 70–99)
POTASSIUM: 4 meq/L (ref 3.7–5.3)
SODIUM: 134 meq/L — AB (ref 137–147)

## 2014-01-14 MED ORDER — CIPROFLOXACIN HCL 500 MG PO TABS
500.0000 mg | ORAL_TABLET | Freq: Two times a day (BID) | ORAL | Status: DC
  Administered 2014-01-14 – 2014-01-15 (×2): 500 mg via ORAL
  Filled 2014-01-14 (×5): qty 1

## 2014-01-14 MED ORDER — METRONIDAZOLE 500 MG PO TABS
500.0000 mg | ORAL_TABLET | Freq: Three times a day (TID) | ORAL | Status: DC
  Administered 2014-01-14 – 2014-01-15 (×4): 500 mg via ORAL
  Filled 2014-01-14 (×6): qty 1

## 2014-01-14 MED ORDER — SENNOSIDES-DOCUSATE SODIUM 8.6-50 MG PO TABS
1.0000 | ORAL_TABLET | Freq: Two times a day (BID) | ORAL | Status: DC
  Administered 2014-01-14 – 2014-01-15 (×3): 1 via ORAL
  Filled 2014-01-14 (×3): qty 1

## 2014-01-14 MED ORDER — FUROSEMIDE 10 MG/ML IJ SOLN
40.0000 mg | Freq: Every day | INTRAMUSCULAR | Status: DC
  Administered 2014-01-15: 40 mg via INTRAVENOUS
  Filled 2014-01-14: qty 4

## 2014-01-14 NOTE — Progress Notes (Signed)
Clinical Social Work Department BRIEF PSYCHOSOCIAL ASSESSMENT 01/14/2014  Patient:  Ethan Mclean, Ethan Mclean     Account Number:  [id number]     Admit date:  01/11/2014  Clinical Social Worker:  Carren Rang  Date/Time:  01/14/2014 04:10 PM  Referred by:  Physician  Date Referred:  01/14/2014 Referred for  SNF Placement   Other Referral:   Interview type:  Other - See comment Other interview type:   CSW spoke to patient and patient's wife by bedside    PSYCHOSOCIAL DATA Living Status:  FAMILY Admitted from facility:   Level of care:   Primary support name:  Jurem Wethington Primary support relationship to patient:  SPOUSE Degree of support available:   Good    CURRENT CONCERNS Current Concerns  Post-Acute Placement   Other Concerns:    SOCIAL WORK ASSESSMENT / PLAN Clinical Social Worker received referral for SNF placement at d/c. CSW introduced self and explained reason for visit. CSW explained SNF process and provided SNF packet to patient and patient's wife by bedside.  Patient states he is not sure if he wants to go to SNF but states he is open to thinking about it and wants to talk to their granddaughter about it. Patient's wife asked social worker to speak to granddaughter but states that she thinks SNF will be the option. Patient was agreeable to be faxed out in [facility]. CSW then spoke to granddaughter over the phone and granddaughter states that they have been taking care of patient since hospital admission, but would like patient to go to rehab for a week or so. CSW encouraged family to speak to patient and get him on board to SNF. Patient's granddaughter states that she will talk to him and will speak to social worker tomorrow.   Assessment/plan status:  Psychosocial Support/Ongoing Assessment of Needs Other assessment/ plan:   Information/referral to community resources:   SNF packet    PATIENT'S/FAMILY'S RESPONSE TO PLAN OF CARE: Patient states he wants all  of his options before choosing SNF.        Maree Krabbe, MSW, [facility] [phone]

## 2014-01-14 NOTE — Evaluation (Signed)
Physical Therapy Evaluation Patient Details Name: DAVONN HAMMONTREE MRN: [id number] DOB: 02/05/1926 Today's Date: 01/14/2014   History of Present Illness  3wk history of diarrhea, colitis, weakness  Clinical Impression  Pt with progressive weakness over the last 3 wks to the point that he has required assist for transfers. Pt with elderly wife as only 24hr care and [facility] 3x/wk pt will need increased assist to be able to transfer home and [facility] is the best option given wife's minimal assist . [facility] reports [facility] over [facility] trials of trying to get equipment and therapy previously and somewhat resistant to the idea of SNF because of this. Pt is currently borrowing a scooter from a friend and states he would like to get his own for continued mobility at home. Pt with above and below deficits and will benefit from acute therapy to maximize strength, transfers, mobility and function to decrease burden of care at DC.     Follow Up Recommendations SNF;Supervision/Assistance - 24 hour    Equipment Recommendations  Other (comment);Hospital bed (scooter)    Recommendations for Other Services       Precautions / Restrictions Precautions Precautions: Fall Precaution Comments: non-ambulatory x 3 years Restrictions Weight Bearing Restrictions: No      Mobility  Bed Mobility Overal bed mobility: Needs Assistance Bed Mobility: Supine to Sit     Supine to sit: Mod assist     General bed mobility comments: assist to pivot legs to EOB with cues for hand placement on rail and sequence with assist to elevate trunk. pt able to complete scooting to EOB with cues  Transfers Overall transfer level: Needs assistance Equipment used: None Transfers: Squat Pivot Transfers     Squat pivot transfers: +2 safety/equipment;Mod assist     General transfer comment: cues for hand placement, positioning and sequence with pivot to right with mod assist of belt and pad to translate pelvis  bed to chair and mod assist to scoot back in chair with cues for sequence  Ambulation/Gait                Stairs            Wheelchair Mobility    Modified Rankin (Stroke Patients Only)       Balance                                             Pertinent Vitals/Pain Chronic Right knee pain with limited ROM    Home Living Family/patient expects to be discharged to:: Private residence Living Arrangements: Spouse/significant other;Non-relatives/Friends Available Help at Discharge: Family;Available 24 hours/day;Personal care attendant Type of Home: House       Home Layout: One level Home Equipment: Walker - 2 wheels;Bedside commode;Tub bench;Wheelchair - manual      Prior Function Level of Independence: Needs assistance   Gait / Transfers Assistance Needed: pt reports he can normally transfer from bed to scooter on his own and from scooter to toilet. non-ambulatory x 3years. recently has needed assist with all transfers  ADL's / Homemaking Assistance Needed: family or aide perform all. aide 3x/wk for bathing with tub bench        Hand Dominance        Extremity/Trunk Assessment   Upper Extremity Assessment: Generalized weakness           Lower Extremity Assessment: Generalized weakness;RLE  deficits/detail;LLE deficits/detail RLE Deficits / Details: very limited ROM grossly 30degrees due to knee pain with movement, strength grossly 2/5 LLE Deficits / Details: strength grossly 2/5  Cervical / Trunk Assessment: Normal  Communication   Communication: HOH  Cognition Arousal/Alertness: Awake/alert Behavior During Therapy: Flat affect Overall Cognitive Status: Impaired/Different from baseline Area of Impairment: Safety/judgement         Safety/Judgement: Decreased awareness of deficits;Decreased awareness of safety          General Comments      Exercises General Exercises - Lower Extremity Long Arc Quad:  AROM;Seated;Left;10 reps      Assessment/Plan    PT Assessment Patient needs continued PT services  PT Diagnosis Generalized weakness   PT Problem List Decreased strength;Decreased cognition;Decreased activity tolerance;Decreased balance;Decreased mobility  PT Treatment Interventions DME instruction;Functional mobility training;Therapeutic activities;Therapeutic exercise;Patient/family education;Wheelchair mobility training   PT Goals (Current goals can be found in the Care Plan section) Acute Rehab PT Goals Patient Stated Goal: be able to return home PT Goal Formulation: With patient Time For Goal Achievement: 01/28/14 Potential to Achieve Goals: Fair    Frequency Min 3X/week   Barriers to discharge Decreased caregiver support      Co-evaluation               End of Session Equipment Utilized During Treatment: Gait belt Activity Tolerance: Patient limited by fatigue Patient left: in chair;with call bell/phone within reach;with chair alarm set;with nursing/sitter in room Nurse Communication: Mobility status;Precautions         Time: 0730-0801 PT Time Calculation (min): 31 min   Charges:   PT Evaluation $Initial PT Evaluation Tier I: 1 Procedure PT Treatments $Therapeutic Activity: 23-37 mins   PT G Codes:          Kaiyu Mirabal B Cristan Scherzer 01/14/2014, 9:28 AM Delaney MeigsMaija Tabor Aylanie Cubillos, PT [phone]

## 2014-01-14 NOTE — Progress Notes (Signed)
Clinical Social Work Department CLINICAL SOCIAL WORK PLACEMENT NOTE 01/14/2014  Patient:  Ethan Mclean, Ethan Mclean  Account Number:  [id number] Admit date:  01/11/2014  Clinical Social Worker:  Carren Rang  Date/time:  01/14/2014 04:16 PM  Clinical Social Work is seeking post-discharge placement for this patient at the following level of care:   SKILLED NURSING   (*CSW will update this form in Epic as items are completed)   01/14/2014  Patient/family provided with [facility] Department of Clinical Social Work's list of facilities offering this level of care within the geographic area requested by the patient (or if unable, by the patient's family).  01/14/2014  Patient/family informed of their freedom to choose among providers that offer the needed level of care, that participate in Medicare, Medicaid or managed care program needed by the patient, have an available bed and are willing to accept the patient.  01/14/2014  Patient/family informed of [facility]' ownership interest in [facility], as well as of the fact that they are under no obligation to receive care at this facility.  PASARR submitted to EDS on 01/14/2014 PASARR number received from EDS on 01/14/2014  FL2 transmitted to all facilities in geographic area requested by pt/family on  01/14/2014 FL2 transmitted to all facilities within larger geographic area on   Patient informed that his/her managed care company has contracts with or will negotiate with  certain facilities, including the following:     Patient/family informed of bed offers received:   Patient chooses bed at  Physician recommends and patient chooses bed at    Patient to be transferred to  on   Patient to be transferred to facility by   The following physician request were entered in Epic:   Additional Comments:  Maree Krabbe, MSW, [employer] [phone]

## 2014-01-14 NOTE — Consult Note (Signed)
Primary Physician: Primary Cardiologist:  Kym Groom   HPI: Patient is an80 yo with a history of CHF, DVT, HTN, Type II DM, GERD, thrombocytopenia.  Admitted on 4/25 with diarrhea., weakness.  CT of abdomen with possible colitis Patinet initially hydrated then placed back on lasix po.  ABX started   Echo done that showed LVEF of 30%   Cardiology consulted.    He is followed by Kym Groom in clinic  Has a history of NICM (normal coronary arteries by cath in 2003, cardiolite in 2007 showed fixed inferior defect consistent with soft tissue attenuation)  LVEF 40 to 45%  Asymptomatic PVCs.  He has chronic LE edema from chronic DVTs.   He was last in cardiology clinic in September 2014.  Last echo was 11/02/11 [facility])  LVEF was estimated at 40 to 45%  Mild to moderate MR.    Patient doesn't get around much  Legs weak  Blames on statin. Denes CP  Breathing is OK  No palpitations.  Nausea in hosp, not prior.      Past Medical History  Diagnosis Date  . CHF (congestive heart failure)   . DVT (deep venous thrombosis)     "legs"  . Complication of anesthesia     "slow to wake up after eye OR"  . Type II diabetes mellitus     "dx'd years ago; don't have it anymroe" (01/11/2014)  . GERD (gastroesophageal reflux disease)   . Gout     Medications Prior to Admission  Medication Sig Dispense Refill  . acetaminophen (TYLENOL) 500 MG tablet Take 500 mg by mouth every other day as needed for mild pain.      Marland Kitchen aspirin EC 325 MG tablet Take 325 mg by mouth every other day.      . calcium carbonate (TUMS - DOSED IN MG ELEMENTAL CALCIUM) 500 MG chewable tablet Chew 250 mg by mouth every other day.      . carvedilol (COREG) 25 MG tablet Take 25 mg by mouth 2 (two) times daily with a meal.      . cholecalciferol (VITAMIN D) 1000 UNITS tablet Take 1,000 Units by mouth daily.      . furosemide (LASIX) 20 MG tablet Take 20 mg by mouth daily as needed for fluid.      Marland Kitchen lisinopril (PRINIVIL,ZESTRIL) 20 MG  tablet Take 10 mg by mouth daily.       Marland Kitchen loperamide (IMODIUM) 2 MG capsule Take 2 mg by mouth daily as needed for diarrhea or loose stools.      Marland Kitchen loratadine (CLARITIN) 10 MG tablet Take 10 mg by mouth every evening.      . Omega-3 Fatty Acids (OMEGA 3 PO) Take 2 capsules by mouth 2 (two) times daily.      Marland Kitchen omeprazole (PRILOSEC OTC) 20 MG tablet Take 20 mg by mouth every other day.      Marland Kitchen oxymetazoline (AFRIN) 0.05 % nasal spray Place 1 spray into both nostrils 2 (two) times daily as needed for congestion.      Bertram Gala Glycol-Propyl Glycol (SYSTANE) 0.4-0.3 % SOLN Place 1 drop into both eyes 2 (two) times daily.      . Probiotic Product (PROBIOTIC COLON SUPPORT) CAPS Take 1 capsule by mouth daily.      . sodium chloride (OCEAN) 0.65 % SOLN nasal spray Place 1 spray into both nostrils as needed for congestion.      Marland Kitchen terazosin (HYTRIN) 2 MG capsule Take 2  mg by mouth at bedtime.         Marland Kitchen. acidophilus  1 capsule Oral Daily  . aspirin EC  325 mg Oral QODAY  . carvedilol  25 mg Oral BID WC  . ciprofloxacin  500 mg Oral BID  . enoxaparin (LOVENOX) injection  40 mg Subcutaneous Q24H  . folic acid  1 mg Oral Daily  . furosemide  20 mg Intravenous Daily  . lisinopril  10 mg Oral Daily  . loratadine  10 mg Oral QPM  . metroNIDAZOLE  500 mg Oral 3 times per day  . pantoprazole  40 mg Oral QODAY  . senna-docusate  1 tablet Oral BID  . sodium chloride  3 mL Intravenous Q12H  . sodium chloride  3 mL Intravenous Q12H  . thiamine  100 mg Oral Daily    Infusions:    Allergies  Allergen Reactions  . Iohexol      Desc: contrast induced renal insufficiency in past, entered 12/25/2004 bsw     . Sulfonamide Derivatives     unknown    History   Social History  . Marital Status: Married    Spouse Name: N/A    Number of Children: N/A  . Years of Education: N/A   Occupational History  . Not on file.   Social History Main Topics  . Smoking status: Former Smoker -- 5 years    Types:  Cigarettes    Quit date: 09/23/1951  . Smokeless tobacco: Never Used  . Alcohol Use: Yes     Comment: 01/11/2014 "drank q once in a while; never was a problem; haven't had a drink for ~ 1 month"  . Drug Use: No  . Sexual Activity: Not on file   Other Topics Concern  . Not on file   Social History Narrative  . No narrative on file    Family History  Problem Relation Age of Onset  . Cancer Other     REVIEW OF SYSTEMS:  All systems reviewed  Negative to the above problem except as noted above.    PHYSICAL EXAM: Filed Vitals:   01/14/14 0900  BP: 119/75  Pulse: 69  Temp:   Resp:      Intake/Output Summary (Last 24 hours) at 01/14/14 1019 Last data filed at 01/14/14 0351  Gross per 24 hour  Intake    660 ml  Output    900 ml  Net   -240 ml    General:  Well appearing. No respiratory difficulty HEENT: normal Neck: supple. no JVD. Carotids 2+ bilat; no bruits. No lymphadenopathy or thryomegaly appreciated. Cor: PMI nondisplaced. Regular rate & rhythm. No rubs, gallops or murmurs. Lungs: Minimal rhonchi.  NO rales.   Abdomen: soft,nondistended. No hepatosplenomegaly. No bruits or masses. Mild diffuse tendernesss Good bowel sounds. Extremities: no cyanosis, clubbing, rash  2+ edema (patient says baseline)   Neuro: alert & oriented x 3, cranial nerves grossly intact. moves all 4 extremities w/o difficulty. Affect pleasant.  ECG:  SR  First degree AV  Block with PACs.  Septal MI    Results for orders placed during the hospital encounter of 01/11/14 (from the past 24 hour(s))  CBC     Status: Abnormal   Collection Time    01/14/14  6:10 AM      Result Value Ref Range   WBC 6.5  4.0 - 10.5 K/uL   RBC 3.82 (*) 4.22 - 5.81 MIL/uL   Hemoglobin 12.2 (*) 13.0 - 17.0 g/dL  HCT 35.9 (*) 39.0 - 52.0 %   MCV 94.0  78.0 - 100.0 fL   MCH 31.9  26.0 - 34.0 pg   MCHC 34.0  30.0 - 36.0 g/dL   RDW 16.1  09.6 - 04.5 %   Platelets 92 (*) 150 - 400 K/uL  BASIC METABOLIC PANEL      Status: Abnormal   Collection Time    01/14/14  6:10 AM      Result Value Ref Range   Sodium 134 (*) 137 - 147 mEq/L   Potassium 4.0  3.7 - 5.3 mEq/L   Chloride 102  96 - 112 mEq/L   CO2 20  19 - 32 mEq/L   Glucose, Bld 131 (*) 70 - 99 mg/dL   BUN 23  6 - 23 mg/dL   Creatinine, Ser 4.09  0.50 - 1.35 mg/dL   Calcium 8.0 (*) 8.4 - 10.5 mg/dL   GFR calc non Af Amer 56 (*) >90 mL/min   GFR calc Af Amer 64 (*) >90 mL/min   No results found.   ASSESSMENT:  1.  Chronic systolic CHF  Patient is an 80 yo who was admitted for colitis  Hx on NICM. Echo this admit shows LVEF is down.  I have reviewed  Appears to be in 30s. No symtpoms to suggest acute ischemia On exam, probably mild volume increase  BNP was up on admit.  I would give 40 mg IV lasix in AM and follow respnse, renal function.  He takes 20 mg po qd as needed at home.   Continue ACE I and Coreg. I would not pursue further work up as patient recovering from colitis and not having symptoms . Can be followed as outpatient.    2.  HTN  Follow  3.  DVT  Has IVC filter

## 2014-01-14 NOTE — Progress Notes (Signed)
Occupational Therapy Evaluation Patient Details Name: Ethan Mclean MRN: [id number] DOB: [birth year] Today's Date: 01/14/2014    History of Present Illness 3wk history of diarrhea, colitis, weakness   Clinical Impression   Patient presents to OT with decreased ADL independence and safety; will benefit from skilled OT to maximize independence.     Follow Up Recommendations  SNF;Supervision/Assistance - 24 hour    Equipment Recommendations  None recommended by OT (has all needed equipment)    Recommendations for Other Services       Precautions / Restrictions Precautions Precautions: Fall Precaution Comments: non-ambulatory x 3 years      Mobility Bed Mobility                  Transfers                      Balance                                            ADL Overall ADL's : Needs assistance/impaired         Upper Body Bathing: Total assistance   Lower Body Bathing: Total assistance   Upper Body Dressing : Total assistance   Lower Body Dressing: Total assistance                 General ADL Comments: EOB/OOB not attempted as pt requiring +2 A. Patient does not remember working with physical therapy earlier today. States he can normally transfer to [facility] on own but needs A with toileting. Patient reports dependent with sponge bathing, A with getting dressed. Aide rolls him into bathroom in w/c to tub bench and gives him shower 1x/week. Wife present on eval and feels she cannot safely assist pt in his current condition.     Vision                     Perception     Praxis      Pertinent Vitals/Pain No c/o pain     Hand Dominance     Extremity/Trunk Assessment Upper Extremity Assessment Upper Extremity Assessment: Generalized weakness   Lower Extremity Assessment Lower Extremity Assessment: Defer to PT evaluation       Communication Communication Communication: HOH   Cognition Arousal/Alertness:  Awake/alert Behavior During Therapy: Flat affect Overall Cognitive Status: Impaired/Different from baseline Area of Impairment: Safety/judgement     Memory: Decreased short-term memory   Safety/Judgement: Decreased awareness of deficits;Decreased awareness of safety         General Comments       Exercises       Shoulder Instructions      Home Living Family/patient expects to be discharged to:: Private residence Living Arrangements: Spouse/significant other;Non-relatives/Friends Available Help at Discharge: Family;Available 24 hours/day;Personal care attendant Type of Home: House       Home Layout: One level     Bathroom Shower/Tub: Engineer, production Accessibility: Yes How Accessible: Accessible via wheelchair Home Equipment: Walker - 2 wheels;Bedside commode;Tub bench;Wheelchair - manual   Additional Comments: uses BSC for toileting      Prior Functioning/Environment Level of Independence: Needs assistance  Gait / Transfers Assistance Needed: pt reports he can normally transfer from bed to scooter on his own and from scooter to toilet. non-ambulatory x 3years. recently has needed assist with all transfers  ADL's / Homemaking Assistance Needed: family or aide perform all. aide 3x/wk for bathing with tub bench Communication / Swallowing Assistance Needed: HOH      OT Diagnosis: Generalized weakness   OT Problem List: Decreased strength;Decreased activity tolerance;Impaired balance (sitting and/or standing);Decreased cognition;Decreased safety awareness;Decreased knowledge of use of DME or AE   OT Treatment/Interventions: Self-care/ADL training;DME and/or AE instruction;Therapeutic activities;Patient/family education    OT Goals(Current goals can be found in the care plan section) Acute Rehab OT Goals Patient Stated Goal: be able to return home OT Goal Formulation: With patient Time For Goal Achievement: 01/28/14 Potential to Achieve Goals: Fair   OT Frequency: Min 2X/week   Barriers to D/C: Decreased caregiver support  wife unable to assist pt in his current condition/level of assistance required       Co-evaluation              End of Session    Activity Tolerance: Patient tolerated treatment well Patient left: in bed;with call bell/phone within reach;with family/visitor present   Time: [id number] OT Time Calculation (min): 21 min Charges:  OT General Charges $OT Visit: 1 Procedure OT Evaluation $Initial OT Evaluation Tier I: 1 Procedure OT Treatments $Self Care/Home Management : 8-22 mins G-Codes:    Naelani Lafrance A Annastyn Silvey 01/14/2014, 3:16 PM

## 2014-01-14 NOTE — Progress Notes (Addendum)
[facility] PROGRESS NOTE  Ethan BaconDewey C Mclean OZH:[id number] DOB: 1926-03-17 DOA: 01/11/2014 PCP: Lenora BoysFRIED, ROBERT L, MD  Brief narrative: Pt is 80 yo male with CHF and EF 45% (follows with Dr. Mayford Knifeurner), DVT (had IVC filter placed), DM, HTN, [facility] with main concern of 3 weeks duration of watery diarrhea associated with poor oral intake, fatigue, weakness. CT scan of abdomen was done showing possible colitis.   Assessment and Plan:  Principal Problem: Diarrhea with ileus  - likely related to colitis - we will continue Cipro and flagyl, day # 4 today 4/27, will change to PO - C.diff by PCR is negative; stool culture negative - O& P and GI pathogen panel pending  - per GI; follow up on stool studies otherwise follow up outpt in 1-2 weeks after discharge for possible colonoscopy or repeat CT scan - continue acidophilus supplement  Active Problems:  Hypokalemia and hypomagnesemia  - secondary to diarrhea  - supplemented and WNL  - Mg also WNL  HYPERTENSION  - reasonable inpatient control  - continue current BP meds: lasix and lisinopril - normal renal function  CHF, systolic, chronic - received IVF on admission but is on Lasix at home  - restarted lasix 20 mg but placed on IV for now as weight up since admission 198 lbs --> 203 lbs this AM - continue coreg 25 mg PO BID - stopped IV fluids  - 2 D ECHO on this admission showed EF 30% - called cardiology for additional input  - daily weights, I's and O's Acute urinary retention  - secondary to BPH  - in/out cath and may need foley prior to discharge  S/P IVC filter  Thrombocytopenia  - chronic, at baseline 90 - 100 since 2012  - no signs of bleeding   Code Status: Full code Family Communication: family not at the bedside this am Disposition Plan: per PT eval; SNF recommended; SW consulted to assist the discharge plan to SNF  Consultants:  GI (Dr. Vida RiggerMarc Magod) Cardiology Procedures/Studies:  Ct Abdomen Pelvis Wo  Contrast 01/11/2014 Small bibasilar pleural effusions and atelectasis. Small supraumbilical ventral hernia containing fat. Prostatic enlargement. Sigmoid diverticulosis without evidence of diverticulitis. Questionable wall thickening of the ascending colon versus artifact from underdistention; unable to exclude colonic neoplasm or colitis; recommend correlation with colonoscopy to exclude tumor.  Dg Abd Acute W/chest 01/11/2014 No acute cardiopulmonary disease. Fluid levels without dilated bowel. This may represent a diffuse ileus.  Antibiotics:  Cipro 01/11/2014 -->  Flagyl 4/24 -->   HPI/Subjective: No events overnight.   Objective: Filed Vitals:   01/13/14 [id number] 01/13/14 2042 01/14/14 0603 01/14/14 0738  BP: 136/89 132/80 112/63 119/71  Pulse: 75 68 65 62  Temp: 97.4 F (36.3 C) 97.6 F (36.4 C) 97.4 F (36.3 C)   TempSrc: Oral Oral Oral   Resp: 18 18 18    Height:      Weight: 92.216 kg (203 lb 4.8 oz)     SpO2: 96% 95% 96%     Intake/Output Summary (Last 24 hours) at 01/14/14 0950 Last data filed at 01/14/14 0351  Gross per 24 hour  Intake    660 ml  Output    900 ml  Net   -240 ml    Exam:  General: Pt is alert, follows commands appropriately, not in acute distress  Cardiovascular: Regular rate and rhythm, S1/S2, no murmurs, no rubs, no gallops  Respiratory: Clear to auscultation bilaterally, no wheezing, bibasilar crackles  Abdomen: Soft, non  tender, non distended, bowel sounds present, no guarding   Data Reviewed: Basic Metabolic Panel:  Recent Labs Lab 01/11/14 1444 01/12/14 0358 01/13/14 0659 01/14/14 0610  NA 139 142 137 134*  K 4.6 3.5* 4.2 4.0  CL 104 109 105 102  CO2 22 20 21 20   GLUCOSE 94 86 92 131*  BUN 25* 21 19 23   CREATININE 0.99 0.90 0.98 1.16  CALCIUM 8.5 8.2* 8.1* 8.0*  MG  --  1.4* 1.6  --   PHOS  --  2.5  --   --    Liver Function Tests:  Recent Labs Lab 01/11/14 1444 01/12/14 0358  AST 16 13  ALT 11 9  ALKPHOS 57 47  BILITOT  0.5 0.7  PROT 6.5 5.4*  ALBUMIN 2.7* 2.2*   CBC:  Recent Labs Lab 01/11/14 1444 01/12/14 0358 01/13/14 0659 01/14/14 0610  WBC 7.6 5.6 6.4 6.5  NEUTROABS 4.8  --   --   --   HGB 13.7 12.1* 12.5* 12.2*  HCT 39.1 35.0* 36.7* 35.9*  MCV 93.8 93.6 94.8 94.0  PLT 97* 85* 81* 92*   Cardiac Enzymes:  Recent Labs Lab 01/12/14 0358 01/12/14 0811  TROPONINI <0.30 <0.30    CLOSTRIDIUM DIFFICILE BY PCR     Status: None   Collection Time    01/11/14  6:36 PM      Result Value Ref Range Status   C difficile by pcr NEGATIVE  NEGATIVE Final  STOOL CULTURE     Status: None   Collection Time    01/11/14  6:36 PM      Result Value Ref Range Status   Specimen Description STOOL   Final   Value: NO SUSPICIOUS COLONIES, CONTINUING TO HOLD     Performed at [employer] [facility]   Report Status PENDING   Incomplete     Scheduled Meds: . acidophilus  1 capsule Oral Daily  . aspirin EC  325 mg Oral QODAY  . carvedilol  25 mg Oral BID WC  . ciprofloxacin  400 mg Intravenous Q12H  . enoxaparin (LOVENOX)  40 mg Subcutaneous Q24H  . folic acid  1 mg Oral Daily  . furosemide  20 mg Intravenous Daily  . lisinopril  10 mg Oral Daily  . loratadine  10 mg Oral QPM  . metronidazole  500 mg Intravenous Q8H  . pantoprazole  40 mg Oral QODAY  . senna-docusate  1 tablet Oral BID  . thiamine  100 mg Oral Daily    Dorothea Ogle, MD  [facility] Pager [phone]  If 7PM-7AM, please contact night-coverage [email or web address] Password [email or web address] 01/14/2014, 9:50 AM   LOS: 3 days

## 2014-01-15 DIAGNOSIS — I5023 Acute on chronic systolic (congestive) heart failure: Secondary | ICD-10-CM

## 2014-01-15 LAB — BASIC METABOLIC PANEL
BUN: 24 mg/dL — AB (ref 6–23)
CO2: 22 meq/L (ref 19–32)
Calcium: 8.3 mg/dL — ABNORMAL LOW (ref 8.4–10.5)
Chloride: 103 mEq/L (ref 96–112)
Creatinine, Ser: 1.24 mg/dL (ref 0.50–1.35)
GFR calc Af Amer: 59 mL/min — ABNORMAL LOW (ref >90)
GFR calc non Af Amer: 51 mL/min — ABNORMAL LOW (ref >90)
GLUCOSE: 92 mg/dL (ref 70–99)
POTASSIUM: 3.9 meq/L (ref 3.7–5.3)
Sodium: 136 mEq/L — ABNORMAL LOW (ref 137–147)

## 2014-01-15 LAB — CBC
HEMATOCRIT: 36.6 % — AB (ref 39.0–52.0)
HEMOGLOBIN: 12.6 g/dL — AB (ref 13.0–17.0)
MCH: 32.2 pg (ref 26.0–34.0)
MCHC: 34.4 g/dL (ref 30.0–36.0)
MCV: 93.6 fL (ref 78.0–100.0)
Platelets: 113 10*3/uL — ABNORMAL LOW (ref 150–400)
RBC: 3.91 MIL/uL — ABNORMAL LOW (ref 4.22–5.81)
RDW: 13.6 % (ref 11.5–15.5)
WBC: 5.8 10*3/uL (ref 4.0–10.5)

## 2014-01-15 LAB — OVA AND PARASITE EXAMINATION

## 2014-01-15 LAB — MAGNESIUM: Magnesium: 1.5 mg/dL (ref 1.5–2.5)

## 2014-01-15 LAB — PRO B NATRIURETIC PEPTIDE: PRO B NATRI PEPTIDE: 2541 pg/mL — AB (ref 0–450)

## 2014-01-15 LAB — STOOL CULTURE

## 2014-01-15 MED ORDER — METRONIDAZOLE 500 MG PO TABS: 500.0000 mg | ORAL_TABLET | Freq: Three times a day (TID) | ORAL | Status: DC

## 2014-01-15 MED ORDER — CIPROFLOXACIN HCL 500 MG PO TABS: 500.0000 mg | ORAL_TABLET | Freq: Two times a day (BID) | ORAL | Status: DC

## 2014-01-15 MED ORDER — HYDROCODONE-ACETAMINOPHEN 5-325 MG PO TABS: 1.0000 | ORAL_TABLET | ORAL | Status: DC | PRN

## 2014-01-15 MED ORDER — FUROSEMIDE 40 MG PO TABS: 40.0000 mg | ORAL_TABLET | Freq: Every day | ORAL | Status: DC

## 2014-01-15 MED ORDER — SENNOSIDES-DOCUSATE SODIUM 8.6-50 MG PO TABS: 1.0000 | ORAL_TABLET | Freq: Two times a day (BID) | ORAL | Status: DC

## 2014-01-15 MED ORDER — LORAZEPAM 0.5 MG PO TABS: 0.5000 mg | ORAL_TABLET | Freq: Every evening | ORAL | Status: DC | PRN

## 2014-01-15 MED ORDER — BISACODYL 10 MG RE SUPP
10.0000 mg | Freq: Every day | RECTAL | Status: DC
  Filled 2014-01-15: qty 1

## 2014-01-15 NOTE — Progress Notes (Signed)
SUBJECTIVE:  No compliants  OBJECTIVE:   Vitals:   Filed Vitals:   01/14/14 1429 01/14/14 1700 01/14/14 2121 01/15/14 0503  BP: 115/66 117/74 106/64 127/80  Pulse: 60 70 63 71  Temp: 97.5 F (36.4 C)  98.2 F (36.8 C) 97.4 F (36.3 C)  TempSrc: Oral  Oral Oral  Resp: 18  18 18   Height:      Weight:      SpO2: 97%  94% 97%   I&O's:   Intake/Output Summary (Last 24 hours) at 01/15/14 1032 Last data filed at 01/14/14 1900  Gross per 24 hour  Intake    240 ml  Output   2500 ml  Net  -2260 ml   TELEMETRY: Reviewed telemetry pt in NSR:     PHYSICAL EXAM General: Well developed, well nourished, in no acute distress Head: Eyes PERRLA, No xanthomas.   Normal cephalic and atramatic  Lungs:   Few crackles at bases Heart:   HRRR S1 S2 Pulses are 2+ & equal. Abdomen: Bowel sounds are positive, abdomen soft and non-tender without masses  Extremities:   No clubbing, cyanosis or edema.  DP +1 Neuro: Alert and oriented X 3. Psych:  Good affect, responds appropriately   LABS: Basic Metabolic Panel:  Recent Labs  54/49/20 0659 01/14/14 0610 01/15/14 0700  NA 137 134* 136*  K 4.2 4.0 3.9  CL 105 102 103  CO2 21 20 22   GLUCOSE 92 131* 92  BUN 19 23 24*  CREATININE 0.98 1.16 1.24  CALCIUM 8.1* 8.0* 8.3*  MG 1.6  --  1.5   Liver Function Tests: No results found for this basename: AST, ALT, ALKPHOS, BILITOT, PROT, ALBUMIN,  in the last 72 hours No results found for this basename: LIPASE, AMYLASE,  in the last 72 hours CBC:  Recent Labs  01/14/14 0610 01/15/14 0700  WBC 6.5 5.8  HGB 12.2* 12.6*  HCT 35.9* 36.6*  MCV 94.0 93.6  PLT 92* 113*   Cardiac Enzymes: No results found for this basename: CKTOTAL, CKMB, CKMBINDEX, TROPONINI,  in the last 72 hours BNP: No components found with this basename: POCBNP,  D-Dimer: No results found for this basename: DDIMER,  in the last 72 hours Hemoglobin A1C: No results found for this basename: HGBA1C,  in the last 72  hours Fasting Lipid Panel: No results found for this basename: CHOL, HDL, LDLCALC, TRIG, CHOLHDL, LDLDIRECT,  in the last 72 hours Thyroid Function Tests: No results found for this basename: TSH, T4TOTAL, FREET3, T3FREE, THYROIDAB,  in the last 72 hours Anemia Panel: No results found for this basename: VITAMINB12, FOLATE, FERRITIN, TIBC, IRON, RETICCTPCT,  in the last 72 hours Coag Panel:   Lab Results  Component Value Date   INR 1.29 01/11/2014   INR 1.65* 11/11/2010   INR 1.3 07/06/2008    RADIOLOGY: Ct Abdomen Pelvis Wo Contrast  01/11/2014   CLINICAL DATA:  Diarrhea, ileus, history diabetes, hypertension, CHF, colonic diverticulosis  EXAM: CT ABDOMEN AND PELVIS WITHOUT CONTRAST  TECHNIQUE: Multidetector CT imaging of the abdomen and pelvis was performed following the standard protocol without intravenous contrast. Sagittal and coronal MPR images reconstructed from axial data set. Patient drank dilute oral contrast for exam. IV contrast not administered due to history of Iohexol allergy.  COMPARISON:  CT abdomen 12/03/2003  FINDINGS: Small bibasilar pleural effusions and atelectasis.  Extensive atherosclerotic calcifications including coronary arteries.  IVC filter noted.  Within limits of a nonenhanced exam, no focal abnormalities of the liver,  spleen, pancreas, or adrenal glands.  Bilateral renal cortical atrophy with a mid right renal cyst 3.8 x 2.5 cm image 40.  No urinary tract calcification or dilatation.  Small supraumbilical ventral hernia containing fat, fascial defect 13 x 12 mm.  Prostatic enlargement, gland 6.5 x 5.0 x 6 8 cm.  Unremarkable bladder and ureters.  Sigmoid diverticulosis without evidence of diverticulitis.  Appendix surgically absent by history.  Nonspecific stranding identified at the inferior aspect of the right perinephric space and Zuckerkandl's fascia, nonspecific, with additional mild stranding seen within the presacral fat.  Question bowel wall thickening of  ascending colon versus artifact from underdistention.  Stomach and remaining bowel loops unremarkable.  No mass, adenopathy, ascites or free air.  Bones demineralized.  Bilateral fatty replacement of the gluteus minimus.  IMPRESSION: Small bibasilar pleural effusions and atelectasis.  Small supraumbilical ventral hernia containing fat.  Prostatic enlargement.  Sigmoid diverticulosis without evidence of diverticulitis.  Questionable wall thickening of the ascending colon versus artifact from underdistention; unable to exclude colonic neoplasm or colitis; recommend correlation with colonoscopy to exclude tumor.   Electronically Signed   By: Ulyses SouthwardMark  Boles M.D.   On: 01/11/2014 19:03   Dg Abd Acute W/chest  01/11/2014   CLINICAL DATA:  Diarrhea.  EXAM: ACUTE ABDOMEN SERIES (ABDOMEN 2 VIEW & CHEST 1 VIEW)  COMPARISON:  Two-view chest x-ray 06/22/2013  FINDINGS: The heart is enlarged. Atherosclerotic calcifications are again seen in the aortic. The lungs are clear.  Fluid levels are present on the upright images without significant bowel dilation. There is no free air. An IVC filter is in place. Degenerative changes are noted in the lower lumbar spine.  IMPRESSION: 1. No acute cardiopulmonary disease. 2. Fluid levels without dilated bowel. This may represent a diffuse ileus.   Electronically Signed   By: Gennette Pachris  Mattern M.D.   On: 01/11/2014 15:58   ASSESSMENT:  1. Chronic systolic CHF  Patient is an 80 yo who was admitted for colitis Hx on NICM.  Echo this admit shows LVEF is down.  No symtpoms to suggest acute ischemia  On exam, probably mild volume increase BNP was up on admit. He is -3.6L net negative today.  Weight not recorded today - 40 mg IV lasix this AM and follow respnse, renal function. He takes 20 mg po qd as needed at home and will likely need daily PO lasix at discharge  -Continue ACE I and Coreg.  - I would not pursue further work up as patient recovering from colitis and not having symptoms .  Can be followed as outpatient.  2. HTN Follow  3. DVT Has IVC filter     Quintella Reichertraci R Aarion Kittrell, MD  01/15/2014  10:32 AM

## 2014-01-15 NOTE — Care Management Note (Addendum)
    Page 1 of 1   01/15/2014     4:56:28 PM CARE MANAGEMENT NOTE 01/15/2014  Patient:  Ethan Mclean, Ethan Mclean   Account Number:  [id number]  Date Initiated:  01/15/2014  Documentation initiated by:  Letha Cape  Subjective/Objective Assessment:   dx acute encephalopathy  admit- lives alone.     Action/Plan:   pt eval- rec snf.   Anticipated DC Date:  01/15/2014   Anticipated DC Plan:  SKILLED NURSING FACILITY  In-house referral  Clinical Social Worker      DC Planning Services  CM consult      Choice offered to / List presented to:             Status of service:  Completed, signed off Medicare Important Message given?  YES (If response is "NO", the following Medicare IM given date fields will be blank) Date Medicare IM given:  01/11/2014 Date Additional Medicare IM given:  01/15/2014  Discharge Disposition:  SKILLED NURSING FACILITY  Per UR Regulation:  Reviewed for med. necessity/level of care/duration of stay  If discussed at Long Length of Stay Meetings, dates discussed:    Comments:  01/15/18 1653 Letha Cape RN, BSN [phone] NCM spoke with wife and informed her that she can take the script for the motorized scooter to the [facility]  medical store to see if they can help her with obtaining that, per information from [place] with [facility].

## 2014-01-15 NOTE — Progress Notes (Signed)
CSW spoke to patient and patient's wife about bed offers at [facility]. Patient and patient's wife would like to go with [facility] CSW awaiting insurance authorization at this time.  Maree Krabbe, MSW, Theresia Majors [phone]

## 2014-01-15 NOTE — Discharge Summary (Addendum)
Physician Discharge Summary  Ethan Mclean:[id number] DOB: 09-14-1926 DOA: 01/11/2014  PCP: [name], MD  Admit date: 01/11/2014 Discharge date: 01/15/2014  Recommendations for Outpatient Follow-up:  1. Pt will need to follow up with PCP in 2-3 weeks post discharge 2. Please obtain BMP to evaluate electrolytes and kidney function 3. Please also check CBC to evaluate Hg and Hct levels 4. Continue Cipro and Flagyl for 12 more days post discharge  5. Plan d/c SNF today   Discharge Diagnoses: Colitis  Active Problems:   HYPERTENSION   CHF   Ileus   Diarrhea   Colitis   S/P IVC filter   Bilateral leg edema   Nonspecific abnormal electrocardiogram (ECG) (EKG)  Discharge Condition: Stable  Diet recommendation: Regular diet as tolerated   Brief narrative:  Pt is 80 yo male with CHF and EF 45% (follows with [name]), DVT (had IVC filter placed), DM, HTN, presented to [facility] ED with main concern of 3 weeks duration of watery diarrhea associated with poor oral intake, fatigue, weakness. CT scan of abdomen was done showing possible colitis.   Assessment and Plan:  Principal Problem:  Diarrhea with ileus  - likely related to colitis  - we will continue Cipro and flagyl, day # 5 and continue for 12 more days post discharge  - C.diff by PCR is negative; stool culture negative  - O& P and GI pathogen panel pending but negative to date - continue acidophilus supplement  Active Problems:  Hypokalemia and hypomagnesemia  - secondary to diarrhea  - supplemented and WNL  - Mg also WNL  HYPERTENSION  - reasonable inpatient control  - continue current BP meds: lasix and lisinopril  - normal renal function  CHF, systolic, chronic  - received IVF on admission but is on Lasix at home  - restarted lasix 20 mg but placed on IV for now as weight up since admission 198 lbs --> 203 lbs this AM  - continue coreg 25 mg PO BID  - increased the dose of Lasix from 20 mg Q to 40 mg QD upon  discharge  - 2 D ECHO on this admission showed EF 30%  Acute urinary retention  - secondary to BPH  - in/out cath as needed S/P IVC filter  Thrombocytopenia  - chronic, at baseline 90 - 100 since 2012  - no signs of bleeding   Code Status: Full code  Family Communication: family not at the bedside this am   Consultants:  GI (Dr. Vida Rigger)  Cardiology Procedures/Studies:  Ct Abdomen Pelvis Wo Contrast 01/11/2014 Small bibasilar pleural effusions and atelectasis. Small supraumbilical ventral hernia containing fat. Prostatic enlargement. Sigmoid diverticulosis without evidence of diverticulitis. Questionable wall thickening of the ascending colon versus artifact from underdistention; unable to exclude colonic neoplasm or colitis; recommend correlation with colonoscopy to exclude tumor.  Dg Abd Acute W/chest 01/11/2014 No acute cardiopulmonary disease. Fluid levels without dilated bowel. This may represent a diffuse ileus.  Antibiotics:  Cipro 01/11/2014 --> 12 more days post d/c Flagyl 4/24 --> 12 more days post d/c   Discharge Exam: Filed Vitals:   01/15/14 0503  BP: 127/80  Pulse: 71  Temp: 97.4 F (36.3 C)  Resp: 18   Filed Vitals:   01/14/14 1429 01/14/14 1700 01/14/14 2121 01/15/14 0503  BP: 115/66 117/74 106/64 127/80  Pulse: 60 70 63 71  Temp: 97.5 F (36.4 C)  98.2 F (36.8 C) 97.4 F (36.3 C)  TempSrc: Oral  Oral  Oral  Resp: 18  18 18   Height:      Weight:      SpO2: 97%  94% 97%    General: Pt is alert, follows commands appropriately, not in acute distress Cardiovascular: Regular rate and rhythm, S1/S2 +, no murmurs, no rubs, no gallops Respiratory: Clear to auscultation bilaterally, no wheezing, no crackles, no rhonchi Abdominal: Soft, non tender, non distended, bowel sounds +, no guarding Extremities: no edema, no cyanosis, pulses palpable bilaterally DP and PT Neuro: Grossly nonfocal  Discharge Instructions     Medication List    STOP taking these  medications       loperamide 2 MG capsule  Commonly known as:  IMODIUM      TAKE these medications       acetaminophen 500 MG tablet  Commonly known as:  TYLENOL  Take 500 mg by mouth every other day as needed for mild pain.     aspirin EC 325 MG tablet  Take 325 mg by mouth every other day.     calcium carbonate 500 MG chewable tablet  Commonly known as:  TUMS - dosed in mg elemental calcium  Chew 250 mg by mouth every other day.     carvedilol 25 MG tablet  Commonly known as:  COREG  Take 25 mg by mouth 2 (two) times daily with a meal.     cholecalciferol 1000 UNITS tablet  Commonly known as:  VITAMIN D  Take 1,000 Units by mouth daily.     ciprofloxacin 500 MG tablet  Commonly known as:  CIPRO  Take 1 tablet (500 mg total) by mouth 2 (two) times daily.     furosemide 40 MG tablet  Commonly known as:  LASIX  Take 1 tablet (40 mg total) by mouth daily.     HYDROcodone-acetaminophen 5-325 MG per tablet  Commonly known as:  NORCO/VICODIN  Take 1-2 tablets by mouth every 4 (four) hours as needed for moderate pain.     lisinopril 20 MG tablet  Commonly known as:  PRINIVIL,ZESTRIL  Take 10 mg by mouth daily.     loratadine 10 MG tablet  Commonly known as:  CLARITIN  Take 10 mg by mouth every evening.     LORazepam 0.5 MG tablet  Commonly known as:  ATIVAN  Take 1 tablet (0.5 mg total) by mouth at bedtime as needed for sleep.     metroNIDAZOLE 500 MG tablet  Commonly known as:  FLAGYL  Take 1 tablet (500 mg total) by mouth every 8 (eight) hours.     OMEGA 3 PO  Take 2 capsules by mouth 2 (two) times daily.     omeprazole 20 MG tablet  Commonly known as:  PRILOSEC OTC  Take 20 mg by mouth every other day.     oxymetazoline 0.05 % nasal spray  Commonly known as:  AFRIN  Place 1 spray into both nostrils 2 (two) times daily as needed for congestion.     PROBIOTIC COLON SUPPORT Caps  Take 1 capsule by mouth daily.     senna-docusate 8.6-50 MG per tablet   Commonly known as:  Senokot-S  Take 1 tablet by mouth 2 (two) times daily.     sodium chloride 0.65 % Soln nasal spray  Commonly known as:  OCEAN  Place 1 spray into both nostrils as needed for congestion.     SYSTANE 0.4-0.3 % Soln  Generic drug:  Polyethyl Glycol-Propyl Glycol  Place 1 drop into both eyes 2 (  two) times daily.     terazosin 2 MG capsule  Commonly known as:  HYTRIN  Take 2 mg by mouth at bedtime.           Follow-up Information   Schedule an appointment as soon as possible for a visit with [name], MD.   Specialty:  Family Medicine   Contact information:   [phone] [place] [address] [phone]       Follow up with Debbora Presto, MD. (As needed, If symptoms worsen, call my cell phone [phone])    Specialty:  Internal Medicine   Contact information:   [address] [place] [address] [phone]        The results of significant diagnostics from this hospitalization (including imaging, microbiology, ancillary and laboratory) are listed below for reference.     Microbiology: Recent Results (from the past 240 hour(s))  CLOSTRIDIUM DIFFICILE BY PCR     Status: None   Collection Time    01/11/14  6:36 PM      Result Value Ref Range Status   C difficile by pcr NEGATIVE  NEGATIVE Final  STOOL CULTURE     Status: None   Collection Time    01/11/14  6:36 PM      Result Value Ref Range Status   Specimen Description STOOL   Final   Special Requests NONE   Final   Culture     Final   Value: NO SUSPICIOUS COLONIES, CONTINUING TO HOLD     Performed at [facility]   Report Status PENDING   Incomplete     Labs: Basic Metabolic Panel:  Recent Labs Lab 01/11/14 1444 01/12/14 0358 01/13/14 0659 01/14/14 0610 01/15/14 0700  NA 139 142 137 134* 136*  K 4.6 3.5* 4.2 4.0 3.9  CL 104 109 105 102 103  CO2 22 20 21 20 22   GLUCOSE 94 86 92 131* 92  BUN 25* 21 19 23  24*  CREATININE 0.99 0.90 0.98 1.16 1.24   CALCIUM 8.5 8.2* 8.1* 8.0* 8.3*  MG  --  1.4* 1.6  --  1.5  PHOS  --  2.5  --   --   --    Liver Function Tests:  Recent Labs Lab 01/11/14 1444 01/12/14 0358  AST 16 13  ALT 11 9  ALKPHOS 57 47  BILITOT 0.5 0.7  PROT 6.5 5.4*  ALBUMIN 2.7* 2.2*   CBC:  Recent Labs Lab 01/11/14 1444 01/12/14 0358 01/13/14 0659 01/14/14 0610 01/15/14 0700  WBC 7.6 5.6 6.4 6.5 5.8  NEUTROABS 4.8  --   --   --   --   HGB 13.7 12.1* 12.5* 12.2* 12.6*  HCT 39.1 35.0* 36.7* 35.9* 36.6*  MCV 93.8 93.6 94.8 94.0 93.6  PLT 97* 85* 81* 92* 113*   Cardiac Enzymes:  Recent Labs Lab 01/12/14 0358 01/12/14 0811  TROPONINI <0.30 <0.30   BNP: BNP (last 3 results)  Recent Labs  01/11/14 1444 01/15/14 0700  PROBNP 9946.0* 2541.0*   SIGNED: Time coordinating discharge: Over 30 minutes  Dorothea Ogle, MD  [facility] 01/15/2014, 10:30 AM Pager [phone]  If 7PM-7AM, please contact night-coverage [email or web address] Password [email or web address]

## 2014-01-15 NOTE — Progress Notes (Signed)
Report called to [employer] unit charge RN took report.

## 2014-01-15 NOTE — Progress Notes (Signed)
Clinical Social Work Department CLINICAL SOCIAL WORK PLACEMENT NOTE 01/15/2014  Patient:  Ethan Mclean, Ethan Mclean  Account Number:  [id number] Admit date:  01/11/2014  Clinical Social Worker:  Carren Rang  Date/time:  01/14/2014 04:16 PM  Clinical Social Work is seeking post-discharge placement for this patient at the following level of care:   SKILLED NURSING   (*CSW will update this form in Epic as items are completed)   01/14/2014  Patient/family provided with [facility] Department of Clinical Social Work's list of facilities offering this level of care within the geographic area requested by the patient (or if unable, by the patient's family).  01/14/2014  Patient/family informed of their freedom to choose among providers that offer the needed level of care, that participate in Medicare, Medicaid or managed care program needed by the patient, have an available bed and are willing to accept the patient.  01/14/2014  Patient/family informed of [facility]' ownership interest in [facility], as well as of the fact that they are under no obligation to receive care at this facility.  PASARR submitted to EDS on 01/14/2014 PASARR number received from EDS on 01/14/2014  FL2 transmitted to all facilities in geographic area requested by pt/family on  01/14/2014 FL2 transmitted to all facilities within larger geographic area on   Patient informed that his/her managed care company has contracts with or will negotiate with  certain facilities, including the following:     Patient/family informed of bed offers received:  01/15/2014 Patient chooses bed at [facility] Physician recommends and patient chooses bed at    Patient to be transferred to [facility] on  01/15/2014 Patient to be transferred to facility by EMS  The following physician request were entered in Epic:   Additional Comments:  Maree Krabbe, MSW, [employer] [phone]

## 2014-01-15 NOTE — Progress Notes (Signed)
OT Cancellation Note  Patient Details Name: YARET PONT MRN: [id number] DOB: 06/16/1926   Cancelled Treatment:     Reason treatment cancelled/not seen today: Per RN staff, pt is being d/c to SNF Rehab this afternoon. Defer further OT & progression to SNF.  Adali Pennings B Yamilet Mcfayden 01/15/2014, 12:39 PM

## 2014-01-15 NOTE — Discharge Instructions (Signed)
Colitis °Colitis is inflammation of the colon. Colitis can be a short-term or long-standing (chronic) illness. Crohn's disease and ulcerative colitis are 2 types of colitis which are chronic. They usually require lifelong treatment. °CAUSES  °There are many different causes of colitis, including: °· Viruses. °· Germs (bacteria). °· Medicine reactions. °SYMPTOMS  °· Diarrhea. °· Intestinal bleeding. °· Pain. °· Fever. °· Throwing up (vomiting). °· Tiredness (fatigue). °· Weight loss. °· Bowel blockage. °DIAGNOSIS  °The diagnosis of colitis is based on examination and stool or blood tests. X-rays, CT scan, and colonoscopy may also be needed. °TREATMENT  °Treatment may include: °· Fluids given through the vein (intravenously). °· Bowel rest (nothing to eat or drink for a period of time). °· Medicine for pain and diarrhea. °· Medicines (antibiotics) that kill germs. °· Cortisone medicines. °· Surgery. °HOME CARE INSTRUCTIONS  °· Get plenty of rest. °· Drink enough water and fluids to keep your urine clear or pale yellow. °· Eat a well-balanced diet. °· Call your caregiver for follow-up as recommended. °SEEK IMMEDIATE MEDICAL CARE IF:  °· You develop chills. °· You have an oral temperature above 102° F (38.9° C), not controlled by medicine. °· You have extreme weakness, fainting, or dehydration. °· You have repeated vomiting. °· You develop severe belly (abdominal) pain or are passing bloody or tarry stools. °MAKE SURE YOU:  °· Understand these instructions. °· Will watch your condition. °· Will get help right away if you are not doing well or get worse. °Document Released: 10/14/2004 Document Revised: 11/29/2011 Document Reviewed: 01/09/2010 °ExitCare® Patient Information ©2014 [employer]. ° °

## 2014-01-15 NOTE — Progress Notes (Signed)
CSW received insurance authorization for a SNF stay. Patient would like [facility]. CSW explained that discharge is today per MD. Patient stated he was not happy about feeling rushed out of the hospital. CSW listened and provided support. Patient and patient's wife want EMS transfer, patient stated, "the sooner the better." CSW will arrange for transportation for 3pm. CSW signing off at this time. Please re consult if social work needs arise.  Maree Krabbe, MSW, Theresia Majors [phone]

## 2014-01-15 NOTE — Progress Notes (Signed)
NURSING PROGRESS NOTE  Ethan Mclean [id number] Discharge Data: 01/15/2014 6:07 PM Attending Provider: Dorothea Ogle, MD JGG:[id number], [name], MD     Ronny Bacon to be D/C'd SNF per MD order.  After Visit Summary given to [facility]. All IV's discontinued with no bleeding noted. All belongings returned to patient for patient to take home.   Last Vital Signs:  Blood pressure 125/73, pulse 87, temperature 97.9 F (36.6 C), temperature source Oral, resp. rate 18, height 6\' 3"  (1.905 m), weight 92.216 kg (203 lb 4.8 oz), SpO2 96.00%.  Discharge Medication List   Medication List    STOP taking these medications       loperamide 2 MG capsule  Commonly known as:  IMODIUM      TAKE these medications       acetaminophen 500 MG tablet  Commonly known as:  TYLENOL  Take 500 mg by mouth every other day as needed for mild pain.     aspirin EC 325 MG tablet  Take 325 mg by mouth every other day.     calcium carbonate 500 MG chewable tablet  Commonly known as:  TUMS - dosed in mg elemental calcium  Chew 250 mg by mouth every other day.     carvedilol 25 MG tablet  Commonly known as:  COREG  Take 25 mg by mouth 2 (two) times daily with a meal.     cholecalciferol 1000 UNITS tablet  Commonly known as:  VITAMIN D  Take 1,000 Units by mouth daily.     ciprofloxacin 500 MG tablet  Commonly known as:  CIPRO  Take 1 tablet (500 mg total) by mouth 2 (two) times daily.     furosemide 40 MG tablet  Commonly known as:  LASIX  Take 1 tablet (40 mg total) by mouth daily.     HYDROcodone-acetaminophen 5-325 MG per tablet  Commonly known as:  NORCO/VICODIN  Take 1-2 tablets by mouth every 4 (four) hours as needed for moderate pain.     lisinopril 20 MG tablet  Commonly known as:  PRINIVIL,ZESTRIL  Take 10 mg by mouth daily.     loratadine 10 MG tablet  Commonly known as:  CLARITIN  Take 10 mg by mouth every evening.     LORazepam 0.5 MG tablet  Commonly known as:   ATIVAN  Take 1 tablet (0.5 mg total) by mouth at bedtime as needed for sleep.     metroNIDAZOLE 500 MG tablet  Commonly known as:  FLAGYL  Take 1 tablet (500 mg total) by mouth every 8 (eight) hours.     OMEGA 3 PO  Take 2 capsules by mouth 2 (two) times daily.     omeprazole 20 MG tablet  Commonly known as:  PRILOSEC OTC  Take 20 mg by mouth every other day.     oxymetazoline 0.05 % nasal spray  Commonly known as:  AFRIN  Place 1 spray into both nostrils 2 (two) times daily as needed for congestion.     PROBIOTIC COLON SUPPORT Caps  Take 1 capsule by mouth daily.     senna-docusate 8.6-50 MG per tablet  Commonly known as:  Senokot-S  Take 1 tablet by mouth 2 (two) times daily.     sodium chloride 0.65 % Soln nasal spray  Commonly known as:  OCEAN  Place 1 spray into both nostrils as needed for congestion.     SYSTANE 0.4-0.3 % Soln  Generic drug:  Polyethyl Glycol-Propyl  Glycol  Place 1 drop into both eyes 2 (two) times daily.     terazosin 2 MG capsule  Commonly known as:  HYTRIN  Take 2 mg by mouth at bedtime.         Cathlyn Parsonsattha Malea Swilling, RN

## 2014-01-16 ENCOUNTER — Encounter: Payer: Self-pay | Admitting: *Deleted

## 2014-01-16 ENCOUNTER — Non-Acute Institutional Stay (SKILLED_NURSING_FACILITY): Payer: Medicare Other | Admitting: Adult Health

## 2014-01-16 DIAGNOSIS — I10 Essential (primary) hypertension: Secondary | ICD-10-CM

## 2014-01-16 DIAGNOSIS — N401 Enlarged prostate with lower urinary tract symptoms: Secondary | ICD-10-CM

## 2014-01-16 DIAGNOSIS — N139 Obstructive and reflux uropathy, unspecified: Secondary | ICD-10-CM

## 2014-01-16 DIAGNOSIS — D693 Immune thrombocytopenic purpura: Secondary | ICD-10-CM

## 2014-01-16 DIAGNOSIS — R197 Diarrhea, unspecified: Secondary | ICD-10-CM

## 2014-01-16 DIAGNOSIS — N138 Other obstructive and reflux uropathy: Secondary | ICD-10-CM

## 2014-01-16 DIAGNOSIS — I509 Heart failure, unspecified: Secondary | ICD-10-CM

## 2014-01-16 LAB — GI PATHOGEN PANEL BY PCR, STOOL
C DIFFICILE TOXIN A/B: NEGATIVE
CAMPYLOBACTER BY PCR: NEGATIVE
Cryptosporidium by PCR: NEGATIVE
E COLI (ETEC) LT/ST: NEGATIVE
E COLI (STEC): NEGATIVE
E coli 0157 by PCR: NEGATIVE
G lamblia by PCR: NEGATIVE
Norovirus GI/GII: NEGATIVE
Rotavirus A by PCR: NEGATIVE
Salmonella by PCR: NEGATIVE
Shigella by PCR: NEGATIVE

## 2014-01-17 ENCOUNTER — Encounter: Payer: Self-pay | Admitting: Adult Health

## 2014-01-17 DIAGNOSIS — N138 Other obstructive and reflux uropathy: Secondary | ICD-10-CM | POA: Insufficient documentation

## 2014-01-17 DIAGNOSIS — D693 Immune thrombocytopenic purpura: Secondary | ICD-10-CM | POA: Insufficient documentation

## 2014-01-17 DIAGNOSIS — N401 Enlarged prostate with lower urinary tract symptoms: Secondary | ICD-10-CM

## 2014-01-17 NOTE — Progress Notes (Signed)
Patient ID: Ethan BaconDewey C Froelich, male   DOB: [birth year], 80 y.o.   MRN: [id number]               PROGRESS NOTE  DATE: 01/16/2014  FACILITY: Nursing Home [facility]  LEVEL OF CARE: SNF (31)  Acute Visit  CHIEF COMPLAINT: Follow-up Hospitalization  HISTORY OF PRESENT ILLNESS: This is an 80 year old male who has [facility] with principal problem of Diarrhea with Ileus. He has been admitted for a short-term rehabilitation.  REASSESSMENT OF ONGOING PROBLEM(S):  HTN: Pt 's HTN remains stable.  Denies CP, sob, DOE, pedal edema, headaches, dizziness or visual disturbances.  No complications from the medications currently being used.  Last BP : 109/60  CHF:The patient does not relate significant weight changes, denies sob, DOE, orthopnea, PNDs, pedal edema, palpitations or chest pain.  CHF remains stable.  No complications form the medications being used.  BPH: The patient's BPH remains stable. Patient denies urinary hesitancy or dribbling. No complications reported from the current medications being used.  PAST MEDICAL HISTORY : Reviewed.  No changes/see problem list  CURRENT MEDICATIONS: Reviewed per MAR/see medication list  REVIEW OF SYSTEMS:  GENERAL: no change in appetite, no fatigue, no weight changes, no fever, chills or weakness RESPIRATORY: no cough, SOB, DOE, wheezing, hemoptysis CARDIAC: no chest pain, or palpitations, + edema GI: no abdominal pain, heart burn, nausea or vomiting, +diarrhea  PHYSICAL EXAMINATION  GENERAL: no acute distress, normal body habitus EYES: conjunctivae normal, sclerae normal, normal eye lids NECK: supple, trachea midline, no neck masses, no thyroid tenderness, no thyromegaly LYMPHATICS: no LAN in the neck, no supraclavicular LAN RESPIRATORY: breathing is even & unlabored, BS CTAB CARDIAC: RRR, no murmur,no extra heart sounds, BLE edema 2+ GI: abdomen soft, normal BS,  no masses, no tenderness, no hepatomegaly, no splenomegaly EXTREMITIES: has difficulty moving right hip, able to move LLE and BUE without difficulty 5/5 strength PSYCHIATRIC: the patient is alert & oriented to person, affect & behavior appropriate  LABS/RADIOLOGY: Labs reviewed: Basic Metabolic Panel:  Recent Labs  40/98/1104/24/15 1444 01/12/14 0358 01/13/14 0659 01/14/14 0610 01/15/14 0700  NA 139 142 137 134* 136*  K 4.6 3.5* 4.2 4.0 3.9  CL 104 109 105 102 103  CO2 22 20 21 20 22   GLUCOSE 94 86 92 131* 92  BUN 25* 21 19 23  24*  CREATININE 0.99 0.90 0.98 1.16 1.24  CALCIUM 8.5 8.2* 8.1* 8.0* 8.3*  MG  --  1.4* 1.6  --  1.5  PHOS  --  2.5  --   --   --    Liver Function Tests:  Recent Labs  01/11/14 1444 01/12/14 0358  AST 16 13  ALT 11 9  ALKPHOS 57 47  BILITOT 0.5 0.7  PROT 6.5 5.4*  ALBUMIN 2.7* 2.2*   CBC:  Recent Labs  01/11/14 1444  01/13/14 0659 01/14/14 0610 01/15/14 0700  WBC 7.6  < > 6.4 6.5 5.8  NEUTROABS 4.8  --   --   --   --   HGB 13.7  < > 12.5* 12.2* 12.6*  HCT 39.1  < > 36.7* 35.9* 36.6*  MCV 93.8  < > 94.8 94.0 93.6  PLT 97*  < > 81* 92* 113*  < > = values in this interval not displayed.  Cardiac Enzymes:  Recent Labs  01/12/14 0358 01/12/14 0811  TROPONINI <0.30 <0.30    ASSESSMENT/PLAN:  Diarrhea with Ileus - C. Dif PCR is negative; continue cipro and flagyl; check BMP Hypertension - well controlled; continue Lasix and lisinopril and Coreg Chronic systolic CHF - stable; continue Lasix and Coreg and Lisinopril BPH - continue Hytrin Chronic thrombocytopenia - stable; check CBC    CPT CODE: 41287  Ella Bodo - NP [facility] [phone]

## 2014-01-18 ENCOUNTER — Encounter: Payer: Self-pay | Admitting: General Surgery

## 2014-01-22 ENCOUNTER — Non-Acute Institutional Stay (SKILLED_NURSING_FACILITY): Payer: Medicare Other | Admitting: Internal Medicine

## 2014-01-22 DIAGNOSIS — K5289 Other specified noninfective gastroenteritis and colitis: Secondary | ICD-10-CM

## 2014-01-22 DIAGNOSIS — N401 Enlarged prostate with lower urinary tract symptoms: Secondary | ICD-10-CM

## 2014-01-22 DIAGNOSIS — N138 Other obstructive and reflux uropathy: Secondary | ICD-10-CM

## 2014-01-22 DIAGNOSIS — N139 Obstructive and reflux uropathy, unspecified: Secondary | ICD-10-CM

## 2014-01-22 DIAGNOSIS — I509 Heart failure, unspecified: Secondary | ICD-10-CM

## 2014-01-22 DIAGNOSIS — I10 Essential (primary) hypertension: Secondary | ICD-10-CM

## 2014-01-22 DIAGNOSIS — K529 Noninfective gastroenteritis and colitis, unspecified: Secondary | ICD-10-CM

## 2014-01-22 NOTE — Progress Notes (Signed)
HISTORY & PHYSICAL  DATE: 01/22/2014   FACILITY: [facility]  LEVEL OF CARE: SNF (31)  ALLERGIES:  Allergies  Allergen Reactions  . Codeine Phosphate   . Iohexol      Desc: contrast induced renal insufficiency in past, entered 12/25/2004 bsw     . Sulfonamide Derivatives     unknown    CHIEF COMPLAINT:  Manage colitis, hypertension and CHF  HISTORY OF PRESENT ILLNESS: 80 year old Caucasian male was hospitalized secondary to watery diarrhea, poor oral intake, fatigue and weakness. After hospitalization he is admitted to this facility for short-term rehabilitation.  COLITIS: CT of the abdomen showed possible colitis. He is being treated with Cipro and Flagyl. He is tolerating the antibiotics without any problems. C. diff by PCR was negative. Stool culture was negative. He denies further diarrhea.  CHF:The patient does not relate significant weight changes, denies sob, DOE, orthopnea, PNDs, palpitations or chest pain.  CHF remains stable.  No complications form the medications being used. Complains of chronic lower extremity swelling. EF 30%.  HTN: Pt 's HTN remains stable.  Denies CP, sob, DOE, headaches, dizziness or visual disturbances.  No complications from the medications currently being used.  Last BP : 10/13/1970, 115/62, 101/50.  PAST MEDICAL HISTORY :  Past Medical History  Diagnosis Date  . CHF (congestive heart failure)   . DVT (deep venous thrombosis)     "legs"  . Complication of anesthesia     "slow to wake up after eye OR"  . Type II diabetes mellitus     "dx'd years ago; don't have it anymroe" (01/11/2014)  . GERD (gastroesophageal reflux disease)   . Gout   . H/O echocardiogram     nonischemi cardiomyopathy EF 40-45%   . Asymptomatic PVCs   . Hypertension   . Statin intolerance   . Balance problems   . Chronic renal insufficiency   . Carpal tunnel syndrome on right   . Gout   . Osteoarthritis   . H/O CHF W/ LE Edema  . Hx  of adenomatous colonic polyps   . Chronic edema     LE from chronic  DVTS    PAST SURGICAL HISTORY: Past Surgical History  Procedure Laterality Date  . Appendectomy    . Vena cava filter placement    . Cataract extraction w/ intraocular lens implant Left   . Tonsillectomy    . Eye surgery Bilateral     "to retain tears"  . Cardiac catheterization  12/2001    Normal coronary arteries by cath 2003    SOCIAL HISTORY:  reports that he quit smoking about 62 years ago. His smoking use included Cigarettes. He smoked 0.00 packs per day for 5 years. He has never used smokeless tobacco. He reports that he drinks alcohol. He reports that he does not use illicit drugs.  FAMILY HISTORY:  Family History  Problem Relation Age of Onset  . Cancer Other     CURRENT MEDICATIONS: Reviewed per MAR/see medication list  REVIEW OF SYSTEMS:  See HPI otherwise 14 point ROS is negative.  PHYSICAL EXAMINATION  VS:  See VS section  GENERAL: no acute distress, normal body habitus EYES: conjunctivae normal, sclerae normal, normal eye lids MOUTH/THROAT: lips without lesions,no lesions in the mouth,tongue is without lesions,uvula elevates in midline NECK: supple, trachea midline, no neck masses, no thyroid tenderness, no thyromegaly LYMPHATICS: no LAN in the neck, no supraclavicular LAN RESPIRATORY: breathing is even &  unlabored, BS CTAB CARDIAC: RRR, no murmur,no extra heart sounds, +3 bilateral lower extremity edema GI:  ABDOMEN: abdomen soft, normal BS, no masses, no tenderness  LIVER/SPLEEN: no hepatomegaly, no splenomegaly MUSCULOSKELETAL: HEAD: normal to inspection & palpation BACK: no kyphosis, scoliosis or spinal processes tenderness EXTREMITIES: LEFT UPPER EXTREMITY: full range of motion, minimal strength & tone RIGHT UPPER EXTREMITY:  full range of motion, minimal strength & tone LEFT LOWER EXTREMITY:  full range of motion, minimal strength & tone RIGHT LOWER EXTREMITY:  Minimal range of  motion, minimal strength & tone PSYCHIATRIC: the patient is alert & oriented to person, affect & behavior appropriate  LABS/RADIOLOGY:  01-17-14 platelets 141 otherwise CBC normal, BUN 34, creatinine 1.4 otherwise BMP normal  Labs reviewed: Basic Metabolic Panel:  Recent Labs  16/10/96 1444 01/12/14 0358 01/13/14 0659 01/14/14 0610 01/15/14 0700  NA 139 142 137 134* 136*  K 4.6 3.5* 4.2 4.0 3.9  CL 104 109 105 102 103  CO2 22 20 21 20 22   GLUCOSE 94 86 92 131* 92  BUN 25* 21 19 23  24*  CREATININE 0.99 0.90 0.98 1.16 1.24  CALCIUM 8.5 8.2* 8.1* 8.0* 8.3*  MG  --  1.4* 1.6  --  1.5  PHOS  --  2.5  --   --   --    Liver Function Tests:  Recent Labs  01/11/14 1444 01/12/14 0358  AST 16 13  ALT 11 9  ALKPHOS 57 47  BILITOT 0.5 0.7  PROT 6.5 5.4*  ALBUMIN 2.7* 2.2*   CBC:  Recent Labs  01/11/14 1444  01/13/14 0659 01/14/14 0610 01/15/14 0700  WBC 7.6  < > 6.4 6.5 5.8  NEUTROABS 4.8  --   --   --   --   HGB 13.7  < > 12.5* 12.2* 12.6*  HCT 39.1  < > 36.7* 35.9* 36.6*  MCV 93.8  < > 94.8 94.0 93.6  PLT 97*  < > 81* 92* 113*  < > = values in this interval not displayed.  Cardiac Enzymes:  Recent Labs  01/12/14 0358 01/12/14 0811  TROPONINI <0.30 <0.30   Transthoracic Echocardiography  Patient:    Ethan Mclean, Caul MR #:       [id number] Study Date: 01/13/2014 Gender:     M Age:        87 Height:     190.5cm Weight:     92.1kg BSA:        2.24m^2 Pt. Status: Room:       5W14C    ADMITTING    Therisa Doyne  Jeanie Sewer  SONOGRAPHER  [place], RDCS, CCT  ATTENDING    Danie Binder  PERFORMING   Chmg, Inpatient cc:  ------------------------------------------------------------ LV EF: 25% -   30%  ------------------------------------------------------------ Indications:      CHF - 428.0.  ------------------------------------------------------------ History:   PMH:   Atrial fibrillation.  Risk factors:   Leg edema. Hypertension. Dyslipidemia.  ------------------------------------------------------------ Study Conclusions  - Left ventricle: The cavity size was mildly dilated.   Systolic function was severely reduced. The estimated   ejection fraction was in the range of 25% to 30%. Severe   diffuse hypokinesis. Possible hypokinesis of the inferior   myocardium. - Aortic valve: Trileaflet; mildly thickened, mildly   calcified leaflets. Mild regurgitation. - Mitral valve: Mild regurgitation. - Left atrium: The atrium was moderately to severely   dilated. Transthoracic echocardiography.  M-mode, complete 2D, spectral Doppler, and color  Doppler.  Height:  Height: 190.5cm. Height: 75in.  Weight:  Weight: 92.1kg. Weight: 202.6lb.  Body mass index:  BMI: 25.4kg/m^2.  Body surface area:    BSA: 2.86m^2.  Blood pressure:     136/89.  Patient status:  Inpatient.  Location:  Bedside.  ------------------------------------------------------------  ------------------------------------------------------------ Left ventricle:  The cavity size was mildly dilated. Systolic function was severely reduced. The estimated ejection fraction was in the range of 25% to 30%.  Severe diffuse hypokinesis.  Regional wall motion abnormalities: Possible hypokinesis of the inferior myocardium.  ------------------------------------------------------------ Aortic valve:   Trileaflet; mildly thickened, mildly calcified leaflets. Mobility was not restricted.  Doppler: Transvalvular velocity was within the normal range. There was no stenosis.  Mild regurgitation.  ------------------------------------------------------------ Aorta:  Aortic root: The aortic root was normal in size.  ------------------------------------------------------------ Mitral valve:   Structurally normal valve.   Mobility was not restricted.  Doppler:  Transvalvular velocity was within the normal range. There was no evidence for stenosis.   Mild regurgitation.  ------------------------------------------------------------ Left atrium:  The atrium was moderately to severely dilated.   ------------------------------------------------------------ Right ventricle:  The cavity size was normal. Wall thickness was normal. Systolic function was normal.  ------------------------------------------------------------ Pulmonic valve:   Not well visualized.  The valve appears to be grossly normal.    Doppler:  Transvalvular velocity was within the normal range. There was no evidence for stenosis.   ------------------------------------------------------------ Tricuspid valve:   Structurally normal valve.    Doppler: Transvalvular velocity was within the normal range.  No regurgitation.  ------------------------------------------------------------ Right atrium:  The atrium was normal in size.  ------------------------------------------------------------ Pericardium:  There was no pericardial effusion.  ------------------------------------------------------------ Systemic veins: Inferior vena cava: The vessel was normal in size.  ------------------------------------------------------------  2D measurements        Normal  Doppler measurements   Normal Left ventricle                 Systemic veins LVID ED,   56.1 mm     43-52   Estimated       3 mm   ------ chord,                         CVP               Hg PLAX LVID ES,   48.1 mm     23-38 chord, PLAX FS, chord,   14 %      >29 PLAX LVPW, ED   8.87 mm     ------ IVS/LVPW   1.12        <1.3 ratio, ED Ventricular septum IVS, ED    9.96 mm     ------ Aorta Root diam,   30 mm     ------ ED Left atrium AP dim       47 mm     ------ AP dim     2.13 cm/m^2 <2.2 index Vol, S     79.7 ml     ------ Vol index, 36.1 ml/m^2 ------ S   ACUTE ABDOMEN SERIES (ABDOMEN 2 VIEW & CHEST 1 VIEW)   COMPARISON:  Two-view chest x-ray 06/22/2013   FINDINGS: The heart is enlarged.  Atherosclerotic calcifications are again seen in the aortic. The lungs are clear.   Fluid levels are present on the upright images without significant bowel dilation. There is no free air. An IVC filter is in place. Degenerative changes are noted in the lower lumbar spine.  IMPRESSION: 1. No acute cardiopulmonary disease. 2. Fluid levels without dilated bowel. This may represent a diffuse ileus. CT ABDOMEN AND PELVIS WITHOUT CONTRAST   TECHNIQUE: Multidetector CT imaging of the abdomen and pelvis was performed following the standard protocol without intravenous contrast. Sagittal and coronal MPR images reconstructed from axial data set. Patient drank dilute oral contrast for exam. IV contrast not administered due to history of Iohexol allergy.   COMPARISON:  CT abdomen 12/03/2003   FINDINGS: Small bibasilar pleural effusions and atelectasis.   Extensive atherosclerotic calcifications including coronary arteries.   IVC filter noted.   Within limits of a nonenhanced exam, no focal abnormalities of the liver, spleen, pancreas, or adrenal glands.   Bilateral renal cortical atrophy with a mid right renal cyst 3.8 x 2.5 cm image 40.   No urinary tract calcification or dilatation.   Small supraumbilical ventral hernia containing fat, fascial defect 13 x 12 mm.   Prostatic enlargement, gland 6.5 x 5.0 x 6 8 cm.   Unremarkable bladder and ureters.   Sigmoid diverticulosis without evidence of diverticulitis.   Appendix surgically absent by history.   Nonspecific stranding identified at the inferior aspect of the right perinephric space and Zuckerkandl's fascia, nonspecific, with additional mild stranding seen within the presacral fat.   Question bowel wall thickening of ascending colon versus artifact from underdistention.   Stomach and remaining bowel loops unremarkable.   No mass, adenopathy, ascites or free air.   Bones demineralized.   Bilateral fatty  replacement of the gluteus minimus.   IMPRESSION: Small bibasilar pleural effusions and atelectasis.   Small supraumbilical ventral hernia containing fat.   Prostatic enlargement.   Sigmoid diverticulosis without evidence of diverticulitis.   Questionable wall thickening of the ascending colon versus artifact from underdistention; unable to exclude colonic neoplasm or colitis; recommend correlation with colonoscopy to exclude tumor.   ASSESSMENT/PLAN:  Colitis-continue antibiotics. CHF-compensated Hypertension-well controlled BPH-continue Flomax GERD-continue PPI Allergic rhinitis-well-controlled  I have reviewed patient's medical records received at admission/from hospitalization.  CPT CODE: 16109  Angela Cox, MD [facility] [phone]

## 2014-01-24 ENCOUNTER — Other Ambulatory Visit: Payer: Self-pay | Admitting: *Deleted

## 2014-01-24 MED ORDER — LORAZEPAM 0.5 MG PO TABS: ORAL_TABLET | ORAL | Status: DC

## 2014-01-24 NOTE — Telephone Encounter (Signed)
[facility] 

## 2014-01-25 ENCOUNTER — Telehealth: Payer: Self-pay | Admitting: Gastroenterology

## 2014-01-25 NOTE — Telephone Encounter (Signed)
I have left a message for [name] to call back

## 2014-01-28 ENCOUNTER — Encounter: Payer: Self-pay | Admitting: Adult Health

## 2014-01-28 ENCOUNTER — Non-Acute Institutional Stay (SKILLED_NURSING_FACILITY): Payer: Medicare Other | Admitting: Adult Health

## 2014-01-28 DIAGNOSIS — F419 Anxiety disorder, unspecified: Secondary | ICD-10-CM

## 2014-01-28 DIAGNOSIS — D693 Immune thrombocytopenic purpura: Secondary | ICD-10-CM

## 2014-01-28 DIAGNOSIS — R197 Diarrhea, unspecified: Secondary | ICD-10-CM

## 2014-01-28 DIAGNOSIS — N401 Enlarged prostate with lower urinary tract symptoms: Secondary | ICD-10-CM

## 2014-01-28 DIAGNOSIS — I10 Essential (primary) hypertension: Secondary | ICD-10-CM

## 2014-01-28 DIAGNOSIS — N138 Other obstructive and reflux uropathy: Secondary | ICD-10-CM

## 2014-01-28 DIAGNOSIS — I509 Heart failure, unspecified: Secondary | ICD-10-CM

## 2014-01-28 DIAGNOSIS — N139 Obstructive and reflux uropathy, unspecified: Secondary | ICD-10-CM

## 2014-01-28 DIAGNOSIS — F411 Generalized anxiety disorder: Secondary | ICD-10-CM

## 2014-01-28 NOTE — Telephone Encounter (Signed)
Left message for patient to call back  

## 2014-01-29 ENCOUNTER — Other Ambulatory Visit (HOSPITAL_COMMUNITY): Payer: Self-pay | Admitting: Internal Medicine

## 2014-01-29 ENCOUNTER — Encounter: Payer: Self-pay | Admitting: Adult Health

## 2014-01-29 DIAGNOSIS — R1310 Dysphagia, unspecified: Secondary | ICD-10-CM

## 2014-01-29 NOTE — Progress Notes (Signed)
Patient ID: Ethan Mclean, male   DOB: 12-Jan-1926, 80 y.o.   MRN: [id number]                 PROGRESS NOTE  DATE: 01/28/14  FACILITY: Nursing Home Location: [facility]  LEVEL OF CARE: SNF (31)  Acute Visit  CHIEF COMPLAINT: Discharge Notes  HISTORY OF PRESENT ILLNESS: This is an 80 year old male who is for discharge home  With Home health PT, OT, Nursing and CNA. DME: Hospital bed, semi-electric due to CHF needing the head of the bed elevated to at least 45 degrees to prevent shortness of breath. He has been admitted to [facility] on 01/15/14 from [facility] with principal problem of Diarrhea with Ileus. Patient was admitted to this facility for short-term rehabilitation after the patient's recent hospitalization.  Patient has completed SNF rehabilitation and therapy has cleared the patient for discharge.   REASSESSMENT OF ONGOING PROBLEM(S):  HTN: Pt 's HTN remains stable.  Denies CP, sob, DOE, pedal edema, headaches, dizziness or visual disturbances.  No complications from the medications currently being used.  Last BP : 127/68  CHF:The patient does not relate significant weight changes, denies sob, DOE, orthopnea, PNDs, pedal edema, palpitations or chest pain.  CHF remains stable.  No complications form the medications being used.  BPH: The patient's BPH remains stable. Patient denies urinary hesitancy or dribbling. No complications reported from the current medications being used.  PAST MEDICAL HISTORY : Reviewed.  No changes/see problem list  CURRENT MEDICATIONS: Reviewed per MAR/see medication list  REVIEW OF SYSTEMS:  GENERAL: no change in appetite, no fatigue, no weight changes, no fever, chills or weakness RESPIRATORY: no cough, SOB, DOE, wheezing, hemoptysis CARDIAC: no chest pain, or palpitations, + edema GI: no abdominal pain, heart burn, nausea or vomiting, +diarrhea  PHYSICAL EXAMINATION  GENERAL: no acute distress, normal body  habitus NECK: supple, trachea midline, no neck masses, no thyroid tenderness, no thyromegaly LYMPHATICS: no LAN in the neck, no supraclavicular LAN RESPIRATORY: breathing is even & unlabored, BS CTAB CARDIAC: RRR, no murmur,no extra heart sounds, BLE edema 2+ GI: abdomen soft, normal BS, no masses, no tenderness, no hepatomegaly, no splenomegaly EXTREMITIES: has difficulty moving right hip, able to move LLE and BUE without difficulty 5/5 strength PSYCHIATRIC: the patient is alert & oriented to person, affect & behavior appropriate  LABS/RADIOLOGY: 01/17/14 sodium 136 potassium 3.9 glucose 104 BUN 34 creatinine 1.4 calcium 8.3 WBC 6.3 hemoglobin 13.1 hematocrit 39.5 platelet 141 Labs reviewed: Basic Metabolic Panel:  Recent Labs  14/78/29 1444 01/12/14 0358 01/13/14 0659 01/14/14 0610 01/15/14 0700  NA 139 142 137 134* 136*  K 4.6 3.5* 4.2 4.0 3.9  CL 104 109 105 102 103  CO2 22 20 21 20 22   GLUCOSE 94 86 92 131* 92  BUN 25* 21 19 23  24*  CREATININE 0.99 0.90 0.98 1.16 1.24  CALCIUM 8.5 8.2* 8.1* 8.0* 8.3*  MG  --  1.4* 1.6  --  1.5  PHOS  --  2.5  --   --   --    Liver Function Tests:  Recent Labs  01/11/14 1444 01/12/14 0358  AST 16 13  ALT 11 9  ALKPHOS 57 47  BILITOT 0.5 0.7  PROT 6.5 5.4*  ALBUMIN 2.7* 2.2*   CBC:  Recent Labs  01/11/14 1444  01/13/14 0659 01/14/14 0610 01/15/14 0700  WBC 7.6  < > 6.4 6.5 5.8  NEUTROABS 4.8  --   --   --   --  HGB 13.7  < > 12.5* 12.2* 12.6*  HCT 39.1  < > 36.7* 35.9* 36.6*  MCV 93.8  < > 94.8 94.0 93.6  PLT 97*  < > 81* 92* 113*  < > = values in this interval not displayed.  Cardiac Enzymes:  Recent Labs  01/12/14 0358 01/12/14 0811  TROPONINI <0.30 <0.30   Transthoracic Echocardiography  Patient:    Ethan Mclean, Ethan Mclean MR #:       [id number] Study Date: 01/13/2014 Gender:     M Age:        87 Height:     190.5cm Weight:     92.1kg BSA:        2.50m^2 Pt. Status: Room:       5W14C    ADMITTING     Therisa Doyne  Jeanie Sewer  SONOGRAPHER  [place] Co, RDCS, CCT  ATTENDING    Danie Binder  PERFORMING   Chmg, Inpatient cc:  ------------------------------------------------------------ LV EF: 25% -   30%  ------------------------------------------------------------ Indications:      CHF - 428.0.  ------------------------------------------------------------ History:   PMH:   Atrial fibrillation.  Risk factors:  Leg edema. Hypertension. Dyslipidemia.  ------------------------------------------------------------ Study Conclusions  - Left ventricle: The cavity size was mildly dilated.   Systolic function was severely reduced. The estimated   ejection fraction was in the range of 25% to 30%. Severe   diffuse hypokinesis. Possible hypokinesis of the inferior   myocardium. - Aortic valve: Trileaflet; mildly thickened, mildly   calcified leaflets. Mild regurgitation. - Mitral valve: Mild regurgitation. - Left atrium: The atrium was moderately to severely   dilated. Transthoracic echocardiography.  M-mode, complete 2D, spectral Doppler, and color Doppler.  Height:  Height: 190.5cm. Height: 75in.  Weight:  Weight: 92.1kg. Weight: 202.6lb.  Body mass index:  BMI: 25.4kg/m^2.  Body surface area:    BSA: 2.21m^2.  Blood pressure:     136/89.  Patient status:  Inpatient.  Location:  Bedside.  ------------------------------------------------------------  ------------------------------------------------------------ Left ventricle:  The cavity size was mildly dilated. Systolic function was severely reduced. The estimated ejection fraction was in the range of 25% to 30%.  Severe diffuse hypokinesis.  Regional wall motion abnormalities: Possible hypokinesis of the inferior myocardium.  ------------------------------------------------------------ Aortic valve:   Trileaflet; mildly thickened, mildly calcified leaflets. Mobility was not restricted.   Doppler: Transvalvular velocity was within the normal range. There was no stenosis.  Mild regurgitation.  ------------------------------------------------------------ Aorta:  Aortic root: The aortic root was normal in size.  ------------------------------------------------------------ Mitral valve:   Structurally normal valve.   Mobility was not restricted.  Doppler:  Transvalvular velocity was within the normal range. There was no evidence for stenosis.  Mild regurgitation.  ------------------------------------------------------------ Left atrium:  The atrium was moderately to severely dilated.   ------------------------------------------------------------ Right ventricle:  The cavity size was normal. Wall thickness was normal. Systolic function was normal.  ------------------------------------------------------------ Pulmonic valve:   Not well visualized.  The valve appears to be grossly normal.    Doppler:  Transvalvular velocity was within the normal range. There was no evidence for stenosis.   ------------------------------------------------------------ Tricuspid valve:   Structurally normal valve.    Doppler: Transvalvular velocity was within the normal range.  No regurgitation.  ------------------------------------------------------------ Right atrium:  The atrium was normal in size.  ------------------------------------------------------------ Pericardium:  There was no pericardial effusion.  ------------------------------------------------------------ Systemic veins: Inferior vena cava: The vessel was normal in size.  ------------------------------------------------------------  2D measurements  Normal  Doppler measurements   Normal Left ventricle                 Systemic veins LVID ED,   56.1 mm     43-52   Estimated       3 mm   ------ chord,                         CVP               Hg PLAX LVID ES,   48.1 mm     23-38 chord, PLAX FS, chord,   14 %       >29 PLAX LVPW, ED   8.87 mm     ------ IVS/LVPW   1.12        <1.3 ratio, ED Ventricular septum IVS, ED    9.96 mm     ------ Aorta Root diam,   30 mm     ------ ED Left atrium AP dim       47 mm     ------ AP dim     2.13 cm/m^2 <2.2 index Vol, S     79.7 ml     ------ Vol index, 36.1 ml/m^2 ------ S ACUTE ABDOMEN SERIES (ABDOMEN 2 VIEW & CHEST 1 VIEW)   COMPARISON:  Two-view chest x-ray 06/22/2013   FINDINGS: The heart is enlarged. Atherosclerotic calcifications are again seen in the aortic. The lungs are clear.   Fluid levels are present on the upright images without significant bowel dilation. There is no free air. An IVC filter is in place. Degenerative changes are noted in the lower lumbar spine.   IMPRESSION: 1. No acute cardiopulmonary disease. 2. Fluid levels without dilated bowel. This may represent a diffuse ileus.     CT ABDOMEN AND PELVIS WITHOUT CONTRAST   TECHNIQUE: Multidetector CT imaging of the abdomen and pelvis was performed following the standard protocol without intravenous contrast. Sagittal and coronal MPR images reconstructed from axial data set. Patient drank dilute oral contrast for exam. IV contrast not administered due to history of Iohexol allergy.   COMPARISON:  CT abdomen 12/03/2003   FINDINGS: Small bibasilar pleural effusions and atelectasis.   Extensive atherosclerotic calcifications including coronary arteries.   IVC filter noted.   Within limits of a nonenhanced exam, no focal abnormalities of the liver, spleen, pancreas, or adrenal glands.   Bilateral renal cortical atrophy with a mid right renal cyst 3.8 x 2.5 cm image 40.   No urinary tract calcification or dilatation.   Small supraumbilical ventral hernia containing fat, fascial defect 13 x 12 mm.   Prostatic enlargement, gland 6.5 x 5.0 x 6 8 cm.   Unremarkable bladder and ureters.   Sigmoid diverticulosis without evidence of diverticulitis.    Appendix surgically absent by history.   Nonspecific stranding identified at the inferior aspect of the right perinephric space and Zuckerkandl's fascia, nonspecific, with additional mild stranding seen within the presacral fat.   Question bowel wall thickening of ascending colon versus artifact from underdistention.   Stomach and remaining bowel loops unremarkable.   No mass, adenopathy, ascites or free air.   Bones demineralized.   Bilateral fatty replacement of the gluteus minimus.   IMPRESSION: Small bibasilar pleural effusions and atelectasis.   Small supraumbilical ventral hernia containing fat.   Prostatic enlargement.   Sigmoid diverticulosis without evidence of diverticulitis.   Questionable wall thickening of the ascending colon versus artifact from  underdistention; unable to exclude colonic neoplasm or colitis; recommend correlation with colonoscopy to exclude tumor.      ASSESSMENT/PLAN:  Diarrhea with Ileus - Resolved Hypertension - well controlled; continue Lasix and lisinopril and Coreg Chronic systolic CHF - stable; continue Lasix and Coreg and Lisinopril BPH - continue Hytrin Chronic thrombocytopenia - stable Anxiety - stable; continue Ativan 0.5 mg PO BID PRN   I have filled out patient's discharge paperwork and written prescriptions.  Patient will receive home health PT, OT, Nursing and CNA.  DME provided: semi-electric hospital bed  Total discharge time: Greater than 30 minutes Discharge time involved coordination of the discharge process with social worker, nursing staff and therapy department. Medical justification for home health services/DME verified.  CPT CODE: [id number]  Ella BodoMonina Vargas - NP [facility] [phone]

## 2014-01-29 NOTE — Telephone Encounter (Signed)
I have left another message for [name] to call back if they still need [place]

## 2014-01-30 ENCOUNTER — Ambulatory Visit (HOSPITAL_COMMUNITY)
Admission: RE | Admit: 2014-01-30 | Discharge: 2014-01-30 | Disposition: A | Discharge status: Home / Self Care | Payer: Medicare Other | Source: Ambulatory Visit | Attending: Internal Medicine | Admitting: Internal Medicine

## 2014-01-30 ENCOUNTER — Other Ambulatory Visit: Payer: Self-pay | Admitting: Family Medicine

## 2014-01-30 DIAGNOSIS — R1310 Dysphagia, unspecified: Secondary | ICD-10-CM

## 2014-01-30 DIAGNOSIS — R6889 Other general symptoms and signs: Secondary | ICD-10-CM | POA: Insufficient documentation

## 2014-01-30 DIAGNOSIS — R1311 Dysphagia, oral phase: Secondary | ICD-10-CM | POA: Insufficient documentation

## 2014-01-30 DIAGNOSIS — R1313 Dysphagia, pharyngeal phase: Secondary | ICD-10-CM | POA: Insufficient documentation

## 2014-01-30 NOTE — Procedures (Signed)
Objective Swallowing Evaluation: Modified Barium Swallowing Study  Patient Details  Name: AYREN STALNAKER MRN: [id number] Date of Birth: [birth year]  Today's Date: 01/30/2014 Time: 1105-1205 SLP Time Calculation (min): 60 min  Past Medical History:  Past Medical History  Diagnosis Date  . CHF (congestive heart failure)   . DVT (deep venous thrombosis)     "legs"  . Complication of anesthesia     "slow to wake up after eye OR"  . Type II diabetes mellitus     "dx'd years ago; don't have it anymroe" (01/11/2014)  . GERD (gastroesophageal reflux disease)   . Gout   . H/O echocardiogram     nonischemi cardiomyopathy EF 40-45%   . Asymptomatic PVCs   . Hypertension   . Statin intolerance   . Balance problems   . Chronic renal insufficiency   . Carpal tunnel syndrome on right   . Gout   . Osteoarthritis   . H/O CHF W/ LE Edema  . Hx of adenomatous colonic polyps   . Chronic edema     LE from chronic  DVTS   Past Surgical History:  Past Surgical History  Procedure Laterality Date  . Appendectomy    . Vena cava filter placement    . Cataract extraction w/ intraocular lens implant Left   . Tonsillectomy    . Eye surgery Bilateral     "to retain tears"  . Cardiac catheterization  12/2001    Normal coronary arteries by cath [id number]   HPI:  80 year old male refered for outpatient MBS due to ~1 week history of choking. Per caregiver, pt has history of neuromuscular disorder affecting the lower extremity  Other PMH is significant for CHF, DM, GERD (not on PPI), Gout.       Assessment / Plan / Recommendation Clinical Impression  Dysphagia Diagnosis: Severe pharyngeal phase dysphagia;Moderate oral phase dysphagia Clinical impression: Limited po presentation during this study, due to the fact that pt exhibited NO swallow reflex.  There was no visible laryngeal or epiglottic movement, and reduced tongue movement.  Pt reported "I just swallowed as hard as I could", but nothing was  visualized.  Once vallecular and pyriform sinuses folled to capacity, spill into the laryngeal vestibule was noted. Voice quality was noted to change, and pt began coughing and clearing his throat.  Stasis was then suctioned from the oropharynx and the study was terminated.  Of note, pt was unable to raise eyebrows or close his eyes, and labial and lingual movement were reduced. Palatal elevation appeared adequate, and pt demonstrated gag reflex during oral suctioning.  Neurological component is highly likely, and further workup by neurologist is strongly encouraged.  Facial, Glossopharyngeal, Vagus, Spinal Accessory and Hypoglossal nerves all appear to be adversely affected, based on presentation, weakness, and  caregiver/pt report.  Results were reviewed with pt and caregiver, who had been communicating with pt's daughter throughout the MBS.  Caregiver was encouraged to contact PCP today (pt has appointment tomorrow), as SLP is unable to recommend safe po diet at this time.  Neurological consult is strongly recommended to determine etiology of pt's deficits.  NPO status is recommended at this time, with consideration of nonoral feeding method for safe and adequate nutrition and hydration.    Treatment Recommendation  Defer treatment plan to SLP at (Comment) (pending neurological consultation and results)    Diet Recommendation NPO   Medication Administration: Via alternative means    Other  Recommendations Recommended Consults: Other (Comment) (Neurologist)  Oral Care Recommendations: Oral care BID   Follow Up Recommendations       Frequency and Duration   n/a     Pertinent Vitals/Pain VSS, no pain reported    SLP Swallow Goals   n/a at this time. Pending neurological workup/results and recommendations   General Date of Onset: 01/18/14 HPI: 80 year old male refered for outpatient MBS due to ~1 week history of choking. Per caregiver, pt has history of neuromuscular disorder affecting the  lower extremity  Other PMH is significant for CHF, DM, GERD (not on PPI), Gout.   Type of Study: Modified Barium Swallowing Study Reason for Referral: Objectively evaluate swallowing function Previous Swallow Assessment: n/a Diet Prior to this Study: Regular;Thin liquids Temperature Spikes Noted: No Respiratory Status: Room air History of Recent Intubation: No Behavior/Cognition: Alert;Cooperative;Pleasant mood;Confused;Requires cueing Oral Cavity - Dentition: Adequate natural dentition Oral Motor / Sensory Function: Impaired motor;Impaired sensory Oral impairment: Right facial;Left facial;Right lingual;Left lingual;Right labial;Left labial Self-Feeding Abilities: Needs assist Patient Positioning: Upright in chair Baseline Vocal Quality: Clear Volitional Cough: Weak Volitional Swallow: Unable to elicit Anatomy: Within functional limits Pharyngeal Secretions: Not observed secondary MBS (Pt reports globus sensation of phlegm)    Reason for Referral Objectively evaluate swallowing function   Oral Phase Oral Preparation/Oral Phase Oral Phase: Impaired Oral - Nectar Oral - Nectar Teaspoon: Weak lingual manipulation;Lingual pumping;Reduced posterior propulsion;Delayed oral transit Oral - Thin Oral - Thin Teaspoon: Weak lingual manipulation;Lingual pumping;Reduced posterior propulsion;Delayed oral transit Oral - Solids Oral - Puree: Reduced posterior propulsion;Delayed oral transit;Weak lingual manipulation;Lingual pumping   Pharyngeal Phase Pharyngeal Phase Pharyngeal Phase: Impaired Pharyngeal - Nectar Pharyngeal - Nectar Teaspoon: Premature spillage to valleculae;Premature spillage to pyriform sinuses;Penetration/Aspiration before swallow;Reduced airway/laryngeal closure;Reduced epiglottic inversion;Reduced pharyngeal peristalsis;Reduced tongue base retraction;Delayed swallow initiation;Reduced laryngeal elevation Penetration/Aspiration details (nectar teaspoon): Material enters airway,  remains ABOVE vocal cords and not ejected out Pharyngeal - Thin Pharyngeal - Thin Teaspoon: Penetration/Aspiration before swallow;Premature spillage to valleculae;Premature spillage to pyriform sinuses;Reduced airway/laryngeal closure;Reduced laryngeal elevation;Reduced anterior laryngeal mobility;Reduced epiglottic inversion;Reduced pharyngeal peristalsis;Reduced tongue base retraction;Delayed swallow initiation Penetration/Aspiration details (thin teaspoon): Material enters airway, remains ABOVE vocal cords and not ejected out Pharyngeal - Solids Pharyngeal - Puree: Penetration/Aspiration before swallow;Premature spillage to valleculae;Premature spillage to pyriform sinuses;Reduced airway/laryngeal closure;Reduced laryngeal elevation;Reduced anterior laryngeal mobility;Reduced epiglottic inversion;Delayed swallow initiation;Reduced pharyngeal peristalsis;Reduced tongue base retraction Penetration/Aspiration details (puree): Material enters airway, remains ABOVE vocal cords and not ejected out Pharyngeal Phase - Comment Pharyngeal Comment: ABSENT SWALLOW REFLEX, NO ANTERIOR LARYNGEAL MOVEMENT/ELEVATION  Cervical Esophageal Phase    GO    Cervical Esophageal Phase Cervical Esophageal Phase:  (NOT OBSERVED)    Functional Assessment Tool Used: Clinical judgment andd objective evaluation of swallow, ASHA noms Functional Limitations: Swallowing Swallow Current Status (O1308(G8996): 100 percent impaired, limited or restricted Swallow Goal Status (M5784(G8997): 100 percent impaired, limited or restricted Swallow Discharge Status (O9629(G8998): 100 percent impaired, limited or restricted   Orville Widmann B. Murvin NatalBueche, [facility] Psychiatric CenterMSP, CCC-SLP [phone]  Gray BernhardtCelia B Kearstin Learn 01/30/2014, 12:41 PM

## 2014-01-31 DIAGNOSIS — R5381 Other malaise: Secondary | ICD-10-CM

## 2014-01-31 DIAGNOSIS — K5190 Ulcerative colitis, unspecified, without complications: Secondary | ICD-10-CM

## 2014-01-31 DIAGNOSIS — M6281 Muscle weakness (generalized): Secondary | ICD-10-CM

## 2014-01-31 DIAGNOSIS — R1312 Dysphagia, oropharyngeal phase: Secondary | ICD-10-CM

## 2014-01-31 DIAGNOSIS — R5383 Other fatigue: Secondary | ICD-10-CM

## 2014-02-01 ENCOUNTER — Ambulatory Visit: Payer: Medicare Other | Admitting: Neurology

## 2014-02-01 ENCOUNTER — Telehealth: Payer: Self-pay | Admitting: Neurology

## 2014-02-01 NOTE — Telephone Encounter (Signed)
This patient did not show for a new patient appointment today. 

## 2014-02-04 ENCOUNTER — Encounter (HOSPITAL_COMMUNITY): Payer: Self-pay | Admitting: *Deleted

## 2014-02-04 ENCOUNTER — Encounter: Payer: Medicare Other | Admitting: Cardiology

## 2014-02-04 ENCOUNTER — Inpatient Hospital Stay (HOSPITAL_COMMUNITY)
Admission: AD | Admit: 2014-02-04 | Discharge: 2014-02-06 | DRG: 040 | Disposition: A | Discharge status: Hospice | Payer: Medicare Other | Source: Skilled Nursing Facility | Attending: Internal Medicine | Admitting: Internal Medicine

## 2014-02-04 DIAGNOSIS — E43 Unspecified severe protein-calorie malnutrition: Secondary | ICD-10-CM | POA: Diagnosis present

## 2014-02-04 DIAGNOSIS — R32 Unspecified urinary incontinence: Secondary | ICD-10-CM | POA: Diagnosis present

## 2014-02-04 DIAGNOSIS — I5022 Chronic systolic (congestive) heart failure: Secondary | ICD-10-CM | POA: Diagnosis present

## 2014-02-04 DIAGNOSIS — E785 Hyperlipidemia, unspecified: Secondary | ICD-10-CM | POA: Diagnosis present

## 2014-02-04 DIAGNOSIS — G1221 Amyotrophic lateral sclerosis: Secondary | ICD-10-CM | POA: Diagnosis present

## 2014-02-04 DIAGNOSIS — I129 Hypertensive chronic kidney disease with stage 1 through stage 4 chronic kidney disease, or unspecified chronic kidney disease: Secondary | ICD-10-CM | POA: Diagnosis present

## 2014-02-04 DIAGNOSIS — R1310 Dysphagia, unspecified: Secondary | ICD-10-CM | POA: Diagnosis present

## 2014-02-04 DIAGNOSIS — Z7401 Bed confinement status: Secondary | ICD-10-CM

## 2014-02-04 DIAGNOSIS — N401 Enlarged prostate with lower urinary tract symptoms: Secondary | ICD-10-CM | POA: Diagnosis present

## 2014-02-04 DIAGNOSIS — N179 Acute kidney failure, unspecified: Secondary | ICD-10-CM | POA: Diagnosis not present

## 2014-02-04 DIAGNOSIS — F411 Generalized anxiety disorder: Secondary | ICD-10-CM | POA: Diagnosis present

## 2014-02-04 DIAGNOSIS — R29898 Other symptoms and signs involving the musculoskeletal system: Secondary | ICD-10-CM | POA: Diagnosis present

## 2014-02-04 DIAGNOSIS — I428 Other cardiomyopathies: Secondary | ICD-10-CM | POA: Diagnosis present

## 2014-02-04 DIAGNOSIS — I509 Heart failure, unspecified: Secondary | ICD-10-CM | POA: Diagnosis present

## 2014-02-04 DIAGNOSIS — Z91041 Radiographic dye allergy status: Secondary | ICD-10-CM

## 2014-02-04 DIAGNOSIS — R5383 Other fatigue: Secondary | ICD-10-CM

## 2014-02-04 DIAGNOSIS — Z86718 Personal history of other venous thrombosis and embolism: Secondary | ICD-10-CM

## 2014-02-04 DIAGNOSIS — L89109 Pressure ulcer of unspecified part of back, unspecified stage: Secondary | ICD-10-CM | POA: Diagnosis present

## 2014-02-04 DIAGNOSIS — Z66 Do not resuscitate: Secondary | ICD-10-CM | POA: Diagnosis present

## 2014-02-04 DIAGNOSIS — Z885 Allergy status to narcotic agent status: Secondary | ICD-10-CM

## 2014-02-04 DIAGNOSIS — I872 Venous insufficiency (chronic) (peripheral): Secondary | ICD-10-CM | POA: Diagnosis present

## 2014-02-04 DIAGNOSIS — L8992 Pressure ulcer of unspecified site, stage 2: Secondary | ICD-10-CM | POA: Diagnosis present

## 2014-02-04 DIAGNOSIS — R5381 Other malaise: Secondary | ICD-10-CM | POA: Diagnosis present

## 2014-02-04 DIAGNOSIS — N189 Chronic kidney disease, unspecified: Secondary | ICD-10-CM | POA: Diagnosis present

## 2014-02-04 DIAGNOSIS — D696 Thrombocytopenia, unspecified: Secondary | ICD-10-CM | POA: Diagnosis present

## 2014-02-04 DIAGNOSIS — R262 Difficulty in walking, not elsewhere classified: Secondary | ICD-10-CM | POA: Diagnosis present

## 2014-02-04 DIAGNOSIS — R600 Localized edema: Secondary | ICD-10-CM | POA: Diagnosis present

## 2014-02-04 DIAGNOSIS — IMO0002 Reserved for concepts with insufficient information to code with codable children: Secondary | ICD-10-CM

## 2014-02-04 DIAGNOSIS — Z515 Encounter for palliative care: Secondary | ICD-10-CM

## 2014-02-04 DIAGNOSIS — Z860100 Personal history of colon polyps, unspecified: Secondary | ICD-10-CM

## 2014-02-04 DIAGNOSIS — M109 Gout, unspecified: Secondary | ICD-10-CM | POA: Diagnosis present

## 2014-02-04 DIAGNOSIS — IMO0001 Reserved for inherently not codable concepts without codable children: Secondary | ICD-10-CM | POA: Diagnosis present

## 2014-02-04 DIAGNOSIS — Z87891 Personal history of nicotine dependence: Secondary | ICD-10-CM

## 2014-02-04 DIAGNOSIS — K219 Gastro-esophageal reflux disease without esophagitis: Secondary | ICD-10-CM | POA: Diagnosis present

## 2014-02-04 DIAGNOSIS — G608 Other hereditary and idiopathic neuropathies: Principal | ICD-10-CM | POA: Diagnosis present

## 2014-02-04 DIAGNOSIS — R609 Edema, unspecified: Secondary | ICD-10-CM | POA: Diagnosis present

## 2014-02-04 DIAGNOSIS — Z882 Allergy status to sulfonamides status: Secondary | ICD-10-CM

## 2014-02-04 DIAGNOSIS — N138 Other obstructive and reflux uropathy: Secondary | ICD-10-CM | POA: Diagnosis present

## 2014-02-04 DIAGNOSIS — M1990 Unspecified osteoarthritis, unspecified site: Secondary | ICD-10-CM | POA: Diagnosis present

## 2014-02-04 DIAGNOSIS — Z9849 Cataract extraction status, unspecified eye: Secondary | ICD-10-CM

## 2014-02-04 DIAGNOSIS — E119 Type 2 diabetes mellitus without complications: Secondary | ICD-10-CM | POA: Diagnosis present

## 2014-02-04 DIAGNOSIS — Z7982 Long term (current) use of aspirin: Secondary | ICD-10-CM

## 2014-02-04 DIAGNOSIS — R471 Dysarthria and anarthria: Secondary | ICD-10-CM | POA: Diagnosis present

## 2014-02-04 LAB — BASIC METABOLIC PANEL
BUN: 58 mg/dL — ABNORMAL HIGH (ref 6–23)
CHLORIDE: 98 meq/L (ref 96–112)
CO2: 22 mEq/L (ref 19–32)
Calcium: 8.7 mg/dL (ref 8.4–10.5)
Creatinine, Ser: 1.42 mg/dL — ABNORMAL HIGH (ref 0.50–1.35)
GFR, EST AFRICAN AMERICAN: 50 mL/min — AB (ref >90)
GFR, EST NON AFRICAN AMERICAN: 43 mL/min — AB (ref >90)
Glucose, Bld: 95 mg/dL (ref 70–99)
POTASSIUM: 4.4 meq/L (ref 3.7–5.3)
Sodium: 134 mEq/L — ABNORMAL LOW (ref 137–147)

## 2014-02-04 LAB — CBC
HEMATOCRIT: 36.4 % — AB (ref 39.0–52.0)
HEMOGLOBIN: 12.7 g/dL — AB (ref 13.0–17.0)
MCH: 31.9 pg (ref 26.0–34.0)
MCHC: 34.9 g/dL (ref 30.0–36.0)
MCV: 91.5 fL (ref 78.0–100.0)
Platelets: 104 10*3/uL — ABNORMAL LOW (ref 150–400)
RBC: 3.98 MIL/uL — ABNORMAL LOW (ref 4.22–5.81)
RDW: 13.5 % (ref 11.5–15.5)
WBC: 9 10*3/uL (ref 4.0–10.5)

## 2014-02-04 LAB — PRO B NATRIURETIC PEPTIDE: PRO B NATRI PEPTIDE: 5728 pg/mL — AB (ref 0–450)

## 2014-02-04 MED ORDER — LIP MEDEX EX OINT
TOPICAL_OINTMENT | CUTANEOUS | Status: AC
  Filled 2014-02-04: qty 7

## 2014-02-04 MED ORDER — MORPHINE SULFATE 2 MG/ML IJ SOLN
1.0000 mg | INTRAMUSCULAR | Status: DC | PRN
Start: 2014-02-04 — End: 2014-02-06

## 2014-02-04 MED ORDER — LIP MEDEX EX OINT
TOPICAL_OINTMENT | CUTANEOUS | Status: DC | PRN
  Administered 2014-02-04: 19:00:00 via TOPICAL

## 2014-02-04 MED ORDER — DEXTROSE-NACL 5-0.9 % IV SOLN
INTRAVENOUS | Status: DC
  Administered 2014-02-04: 18:00:00 via INTRAVENOUS

## 2014-02-04 MED ORDER — LORAZEPAM 0.5 MG PO TABS
0.5000 mg | ORAL_TABLET | Freq: Every day | ORAL | Status: DC
  Administered 2014-02-04 – 2014-02-05 (×2): 0.5 mg via ORAL
  Filled 2014-02-04 (×2): qty 1

## 2014-02-04 MED ORDER — ALBUTEROL SULFATE (2.5 MG/3ML) 0.083% IN NEBU: 2.5000 mg | INHALATION_SOLUTION | RESPIRATORY_TRACT | Status: DC | PRN

## 2014-02-04 MED ORDER — SODIUM CHLORIDE 0.9 % IJ SOLN
3.0000 mL | Freq: Two times a day (BID) | INTRAMUSCULAR | Status: DC
  Administered 2014-02-04 – 2014-02-06 (×3): 3 mL via INTRAVENOUS

## 2014-02-04 NOTE — Consult Note (Signed)
WOC wound consult note Reason for Consult: Stage II pressure ulcer to sacrum, IAD (moisture associated skin damage) resultant from diaper use at home with fecal and urinary incontinence.  Patient now with external catheter (condom catheter) and timely incontinence care using our house products (no briefs). Daughter in room. Wound type: IAD/MASD and Stage II pressure ulcer at sacrum Pressure Ulcer POA: Yes/No Measurement: 1.5cm x 2cm x 0.2cm Wound OIL:[id number], pink, moist Drainage (amount, consistency, odor) Scant serous Periwound: mild erythema that blanches. Dressing procedure/placement/frequency: I will suggest a soft silicone foam dressing made for this area (Allevyn Sacral).  Turning and repositioning per house protocol.  Elevation/floating of bilateral heels with Prevalon boots (heels are currently intact).  A pressure redistribution chair pad is provided for use when patient is OOB in chair. WOC nursing team will not follow, but will remain available to this patient, the nursing and medical team.  Please re-consult if needed. Thanks, Ladona Mow, MSN, RN, GNP, [place], [facility] [phone])

## 2014-02-04 NOTE — Consult Note (Signed)
Reason for Consult: Progressive weakness of extremities and recent onset of dysphagia.  HPI:                                                                                                                                          Ethan Mclean is an 80 y.o. male with a history of hypertension, congestive heart failure, deep vein thromboses, diabetes mellitus and chronic renal insufficiency who has been experiencing progressive weakness for about 4 years. Weakness started in lower extremities. He's been getting around using a scooter for about 3 years. He started noticing weakness involving his upper extremities within the last 8-12 months and has developed severe weakness of his hands. He's had difficulty with swallowing for several months but has been unable to swallow at all for about 3 weeks. Modified barium swallow study on 01/30/2014 showed severe dysphagia and n.p.o. status was recommended. MRI of his brain has been ordered and is pending. He's had progressive dysarthria and has had some episodes of shortness of breath. He has no history of myoclonic symptoms, but underwent cataract extraction in his 80s and has frontal balding. He has not had a cardiac rhythm abnormality. Family history is negative for neuromuscular disorders.  Past Medical History  Diagnosis Date  . CHF (congestive heart failure)   . DVT (deep venous thrombosis)     "legs"  . Complication of anesthesia     "slow to wake up after eye OR"  . Type II diabetes mellitus     "dx'd years ago; don't have it anymroe" (01/11/2014)  . GERD (gastroesophageal reflux disease)   . Gout   . H/O echocardiogram     nonischemi cardiomyopathy EF 40-45%   . Asymptomatic PVCs   . Hypertension   . Statin intolerance   . Balance problems   . Chronic renal insufficiency   . Carpal tunnel syndrome on right   . Gout   . Osteoarthritis   . H/O CHF W/ LE Edema  . Hx of adenomatous colonic polyps   . Chronic edema     LE from chronic  DVTS     Past Surgical History  Procedure Laterality Date  . Appendectomy    . Vena cava filter placement    . Cataract extraction w/ intraocular lens implant Left   . Tonsillectomy    . Eye surgery Bilateral     "to retain tears"  . Cardiac catheterization  12/2001    Normal coronary arteries by cath 2003    Family History  Problem Relation Age of Onset  . Cancer Other     Social History:  reports that he quit smoking about 62 years ago. His smoking use included Cigarettes. He smoked 0.00 packs per day for 5 years. He has never used smokeless tobacco. He reports that he drinks alcohol. He reports that he does not use illicit drugs.  Allergies  Allergen Reactions  . Codeine Phosphate   . Iohexol      Desc: contrast induced renal insufficiency in past, entered 12/25/2004 bsw     . Sulfonamide Derivatives     unknown    MEDICATIONS:                                                                                                                     I have reviewed the patient's current medications.   ROS:                                                                                                                                       History obtained from chart review and the patient  General ROS: negative for - chills, fatigue, fever, night sweats, weight gain or weight loss Psychological ROS: negative for - behavioral disorder, hallucinations, memory difficulties, mood swings or suicidal ideation Ophthalmic ROS: negative for - blurry vision, double vision, eye pain or loss of vision ENT ROS: negative for - epistaxis, nasal discharge, oral lesions, sore throat, tinnitus or vertigo Allergy and Immunology ROS: negative for - hives or itchy/watery eyes Hematological and Lymphatic ROS: negative for - bleeding problems, bruising or swollen lymph nodes Endocrine ROS: negative for - galactorrhea, hair pattern changes, polydipsia/polyuria or temperature intolerance Respiratory ROS:  Occasional coughing with choking Cardiovascular ROS: negative for - chest pain, dyspnea on exertion, edema or irregular heartbeat Gastrointestinal ROS: Severe dysphagia as noted in history of present illness Genito-Urinary ROS: negative for - dysuria, hematuria, incontinence or urinary frequency/urgency Musculoskeletal ROS: As noted in history of present illness Neurological ROS: as noted in HPI Dermatological ROS: negative for rash and skin lesion changes   Blood pressure 98/60, pulse 61, temperature 97.4 F (36.3 C), temperature source Axillary, resp. rate 18, height 6' 3.5" (1.918 m), weight 89.359 kg (197 lb), SpO2 97.00%.   Neurologic Examination:  Mental Status: Alert, oriented, thought content appropriate.  Speech fluent without evidence of aphasia. Able to follow commands without difficulty. Cranial Nerves: II-Visual fields were normal. III/IV/VI-Pupils were equal and reacted. Extraocular movements were full and conjugate.    V/VII-no facial numbness; severe upper facial weakness with moderately severe weakness of orbicularis oculi as well as moderately severe bilateral lower facial weakness. Bilateral temporal wasting noted. Patient had no weakness of muscles of mastication. VIII-normal. X-moderately dysarthric speech; symmetrical palatal movement. XII-midline tongue extension; no clear atrophy. Motor: Moderately severe weakness of neck flexors; moderate weakness proximally of both upper extremities with severe weakness distally. Mild to moderate atrophy of upper extremities proximally as well as moderately severe atrophy distally. Patient had severe weakness of lower extremities as well, proximally and distally. Marked edema of feet noted. No fasciculations were noted. Muscle tone was flaccid throughout. Sensory: Normal throughout. Deep Tendon Reflexes: Absent in upper and lower  extremities; no clear frontal release signs. Plantars: Mute bilaterally Cerebellar: Normal finger-to-nose testing bilaterally.  No results found for this basename: cbc, bmp, coags, chol, tri, ldl, hga1c    Results for orders placed during the hospital encounter of 02/04/14 (from the past 48 hour(s))  BASIC METABOLIC PANEL     Status: Abnormal   Collection Time    02/04/14  6:09 PM      Result Value Ref Range   Sodium 134 (*) 137 - 147 mEq/L   Potassium 4.4  3.7 - 5.3 mEq/L   Chloride 98  96 - 112 mEq/L   CO2 22  19 - 32 mEq/L   Glucose, Bld 95  70 - 99 mg/dL   BUN 58 (*) 6 - 23 mg/dL   Creatinine, Ser 1.42 (*) 0.50 - 1.35 mg/dL   Calcium 8.7  8.4 - 10.5 mg/dL   GFR calc non Af Amer 43 (*) >90 mL/min   GFR calc Af Amer 50 (*) >90 mL/min   Comment: (NOTE)     The eGFR has been calculated using the CKD EPI equation.     This calculation has not been validated in all clinical situations.     eGFR's persistently <90 mL/min signify possible Chronic Kidney     Disease.  CBC     Status: Abnormal   Collection Time    02/04/14  6:09 PM      Result Value Ref Range   WBC 9.0  4.0 - 10.5 K/uL   RBC 3.98 (*) 4.22 - 5.81 MIL/uL   Hemoglobin 12.7 (*) 13.0 - 17.0 g/dL   HCT 36.4 (*) 39.0 - 52.0 %   MCV 91.5  78.0 - 100.0 fL   MCH 31.9  26.0 - 34.0 pg   MCHC 34.9  30.0 - 36.0 g/dL   RDW 13.5  11.5 - 15.5 %   Platelets 104 (*) 150 - 400 K/uL   Comment: PLATELET COUNT CONFIRMED BY SMEAR     LARGE PLATELETS PRESENT  PRO B NATRIURETIC PEPTIDE     Status: Abnormal   Collection Time    02/04/14  6:09 PM      Result Value Ref Range   Pro B Natriuretic peptide (BNP) 5728.0 (*) 0 - 450 pg/mL    No results found.   Assessment/Plan: Progressive neuromuscular disorder over several years with severe weakness of lower extremities as well as severe weakness distally of upper extremities and moderate weakness proximally of upper extremities. In addition patient has developed severe dysphagia and  dysarthria which have been progressive, as well.  Etiology is unclear. Progressive motor neuron disease will need to be ruled out. In addition, given the patient's temporal wasting and severe upper facial weakness, myotonic dystrophy we'll also need to be ruled out.  Recommendations: 1. Electrodiagnostic studies as soon as feasible, preferably during patient's inpatient stay, if possible. 2. Agree with obtaining MRI of the brain. 3. Patient and family to decide on aggressiveness of therapy based on the ventral diagnosis.  C.R. Nicole Kindred, MD [facility] [phone]  02/04/2014, 9:06 PM

## 2014-02-04 NOTE — H&P (Signed)
[facility] History and Physical  Ethan BaconDewey C Mclean WUJ:[id number] DOB: 08-28-1926 DOA: 02/04/2014  Referring physician: Dewaine OatsBo Fried PCP: Lenora BoysFRIED, ROBERT L, MD   Chief Complaint: [name]  HPI: Ethan Mclean is a 80 y.o. male  Past medical history of hypertension and diabetes and CHF who was discharged from the hospital service approximately 3 weeks ago after a hospitalization for diarrhea. Patient had been discharged to a skilled nursing facility and there he underwent a modified barium swallow which noted complete blood count White inability to initiate swallowing function. There have been the plan for patient to go followup with neurology as outpatient, however he was physically unable to be taken there.  Patient in the last year has had progressively worsening loss of motor function of his lower extremities to the point now where he could barely move his feet. This has not affected his sensation. In addition, he also had progressively worsening decline in upper extremity function, but still has some strength and loss of fine motor control and decreasing gross motor control.  When seen his PCP today, did not eat in some time. His primary care physician reached out and had him directly admitted to the hospitalist service for formal neurological evaluation of the cause of his swallowing and overall decline in motor function as well as possible comfort care versus PEG tube placement depending on outcome.   Review of Systems:  Patient seen after arrival to floor. Doing okay. Denies any headaches, chest pain, palpitations, shortness of breath, wheeze, abdominal pain, hematuria, dysuria, constipation, diarrhea pertinent positives include dysphagia, occasional cough when he gets choked up on secretions. It is associated with some mild shortness of breath. He has lower extremity chronic edema from previous DVT filter placement. To that end, he has some chronic decreased sensation in his feet.  Obviously, his biggest complaints are of minimal use of his arms and very little if any motor use of his legs   Past Medical History  Diagnosis Date  . CHF (congestive heart failure)   . DVT (deep venous thrombosis)     "legs"  . Complication of anesthesia     "slow to wake up after eye OR"  . Type II diabetes mellitus     "dx'd years ago; don't have it anymroe" (01/11/2014)  . GERD (gastroesophageal reflux disease)   . Gout   . H/O echocardiogram     nonischemi cardiomyopathy EF 40-45%   . Asymptomatic PVCs   . Hypertension   . Statin intolerance   . Balance problems   . Chronic renal insufficiency   . Carpal tunnel syndrome on right   . Gout   . Osteoarthritis   . H/O CHF W/ LE Edema  . Hx of adenomatous colonic polyps   . Chronic edema     LE from chronic  DVTS   Past Surgical History  Procedure Laterality Date  . Appendectomy    . Vena cava filter placement    . Cataract extraction w/ intraocular lens implant Left   . Tonsillectomy    . Eye surgery Bilateral     "to retain tears"  . Cardiac catheterization  12/2001    Normal coronary arteries by cath 2003   Social History:  reports that he quit smoking about 62 years ago. His smoking use included Cigarettes. He smoked 0.00 packs per day for 5 years. He has never used smokeless tobacco. He reports that he drinks alcohol. He reports that he does not use illicit drugs.  patient lives at home with his wife. At this point he is bedbound   Allergies  Allergen Reactions  . Codeine Phosphate   . Iohexol      Desc: contrast induced renal insufficiency in past, entered 12/25/2004 bsw     . Sulfonamide Derivatives     unknown    Family History  Problem Relation Age of Onset  . Cancer Other      Prior to Admission medications   Medication Sig Start Date End Date Taking? Authorizing Provider  acetaminophen (TYLENOL) 500 MG tablet Take 500 mg by mouth every other day as needed for mild pain.   Yes Historical Provider, MD    aspirin EC 325 MG tablet Take 325 mg by mouth every other day.   Yes Historical Provider, MD  calcium carbonate (TUMS - DOSED IN MG ELEMENTAL CALCIUM) 500 MG chewable tablet Chew 250 mg by mouth every other day.   Yes Historical Provider, MD  carvedilol (COREG) 25 MG tablet Take 25 mg by mouth 2 (two) times daily with a meal.   Yes Historical Provider, MD  furosemide (LASIX) 40 MG tablet Take 20 mg by mouth daily. 01/15/14  Yes Dorothea Ogle, MD  HYDROcodone-acetaminophen (NORCO/VICODIN) 5-325 MG per tablet Take 1 tablet by mouth every 6 (six) hours as needed for moderate pain.   Yes Historical Provider, MD  lisinopril (PRINIVIL,ZESTRIL) 20 MG tablet Take 20 mg by mouth daily.    Yes Historical Provider, MD  loratadine (CLARITIN) 10 MG tablet Take 10 mg by mouth every evening.   Yes Historical Provider, MD  LORazepam (ATIVAN) 0.5 MG tablet Take one tablet by mouth twice daily as needed for anxiety 01/24/14  Yes Claudie Revering, NP  Omega-3 Fatty Acids (OMEGA 3 PO) Take 2 capsules by mouth 2 (two) times daily.   Yes Historical Provider, MD  omeprazole (PRILOSEC OTC) 20 MG tablet Take 20 mg by mouth every other day.   Yes Historical Provider, MD  oxymetazoline (AFRIN) 0.05 % nasal spray Place 1 spray into both nostrils 2 (two) times daily as needed for congestion.   Yes Historical Provider, MD  Polyethyl Glycol-Propyl Glycol (SYSTANE) 0.4-0.3 % SOLN Place 1 drop into both eyes 2 (two) times daily.   Yes Historical Provider, MD  Probiotic Product (PROBIOTIC COLON SUPPORT) CAPS Take 1 capsule by mouth daily.   Yes Historical Provider, MD  psyllium (METAMUCIL) 58.6 % packet Take 1 packet by mouth daily.   Yes Historical Provider, MD  sodium chloride (OCEAN) 0.65 % SOLN nasal spray Place 1 spray into both nostrils as needed for congestion.   Yes Historical Provider, MD  terazosin (HYTRIN) 2 MG capsule Take 2 mg by mouth at bedtime.   Yes Historical Provider, MD   Physical Exam: Filed Vitals:   02/04/14  1417  BP: 98/60  Pulse: 61  Temp: 97.4 F (36.3 C)  Resp: 18    BP 98/60  Pulse 61  Temp(Src) 97.4 F (36.3 C) (Axillary)  Resp 18  Ht 6' 3.5" (1.918 m)  Wt 89.359 kg (197 lb)  BMI 24.29 kg/m2  SpO2 97%  General:   alert and oriented x3, no acute distress  Eyes:  square nonicteric, extraocular movements appear intact  ENT:  normocephalic, atraumatic, mucous membranes are dry  Neck: no LAD, masses or thyromegaly Cardiovascular:  regular rate and rhythm, S1-S2  Respiratory:  clear auscultation bilaterally  Abdomen:  soft nontender, nondistended, positive bowel sounds  Skin: chronic venous stasis  Musculoskeletal:  no clubbing or cyanosis. Patient has 2+ pitting edema from midcalf down bilaterally chronically  Psychiatric:  patient is appropriate, no evidence of psychoses  Neurologic:  due to weakness, patient has difficulty with finger to nose testing. Sensation appears to be for the most part intact. Cranial nerves II through XII are intact.           Labs on Admission:  Basic Metabolic Panel: No results found for this basename: NA, K, CL, CO2, GLUCOSE, BUN, CREATININE, CALCIUM, MG, PHOS,  in the last 168 hours Liver Function Tests: No results found for this basename: AST, ALT, ALKPHOS, BILITOT, PROT, ALBUMIN,  in the last 168 hours No results found for this basename: LIPASE, AMYLASE,  in the last 168 hours No results found for this basename: AMMONIA,  in the last 168 hours CBC: No results found for this basename: WBC, NEUTROABS, HGB, HCT, MCV, PLT,  in the last 168 hours Cardiac Enzymes: No results found for this basename: CKTOTAL, CKMB, CKMBINDEX, TROPONINI,  in the last 168 hours  BNP (last 3 results)  Recent Labs  01/11/14 1444 01/15/14 0700  PROBNP 9946.0* 2541.0*   CBG: No results found for this basename: GLUCAP,  in the last 168 hours  Radiological Exams on Admission: No results found.    Assessment/Plan Active Problems:   DYSLIPIDEMIA: Appears  stable   Chronic systolic heart failure: Check BNP    Bilateral leg edema: Chronic from DVT filter placement.    BPH (benign prostatic hypertrophy) with urinary obstruction: Appears stable    Dysphagia: Principal problem. Neurology to see. And concerned about the possibility given the patient's dramatic decline of motor function only in the past year that he has ALS. He spoke quite frankly about this. He understands and suspected something similar. We'll discuss with neurology after they have seen him. If this is indeed the case, patient is amenable to going home with hospice. He is unsure if he wants a feeding tube.  These things will be decided based on diagnosis   Bilateral leg weakness   Bilateral arm weakness    Code Status: DO NOT RESUSCITATE  Family Communication: Wife, son and other family members at the bedside  Disposition Plan: Possibly home in a few days with hospice depending on diagnosis   Time spent: 45 minutes   Sayaka Hoeppner Mordecai Rasmussen [facility] Pager [phone]

## 2014-02-05 ENCOUNTER — Telehealth: Payer: Self-pay | Admitting: Neurology

## 2014-02-05 ENCOUNTER — Inpatient Hospital Stay (HOSPITAL_COMMUNITY): Payer: Medicare Other

## 2014-02-05 DIAGNOSIS — E43 Unspecified severe protein-calorie malnutrition: Secondary | ICD-10-CM | POA: Diagnosis present

## 2014-02-05 DIAGNOSIS — N179 Acute kidney failure, unspecified: Secondary | ICD-10-CM

## 2014-02-05 DIAGNOSIS — R531 Weakness: Secondary | ICD-10-CM

## 2014-02-05 MED ORDER — FUROSEMIDE 10 MG/ML IJ SOLN
20.0000 mg | Freq: Two times a day (BID) | INTRAMUSCULAR | Status: DC
  Administered 2014-02-05: 20 mg via INTRAVENOUS
  Filled 2014-02-05 (×3): qty 2

## 2014-02-05 MED ORDER — GADOBENATE DIMEGLUMINE 529 MG/ML IV SOLN
18.0000 mL | Freq: Once | INTRAVENOUS | Status: AC | PRN
  Administered 2014-02-05: 18 mL via INTRAVENOUS

## 2014-02-05 NOTE — Telephone Encounter (Signed)
This patient is in the hospital, has possible ALS, will need an evaluation for this. We will set him up for EMG study to be done tomorrow, the patient needs to be here at least by 3 PM, I will do the nerve conduction at 4 PM.

## 2014-02-05 NOTE — Care Management Note (Unsigned)
    Page 1 of 1   02/05/2014     3:49:21 PM CARE MANAGEMENT NOTE 02/05/2014  Patient:  Ethan Mclean, Ethan Mclean   Account Number:  [id number]  Date Initiated:  02/05/2014  Documentation initiated by:  [facility]  Subjective/Objective Assessment:   80 year old male admitted with dysphagia.     Action/Plan:   From home with family. Discharge needs depend on diagnosis after work up is completed.   Anticipated DC Date:  02/08/2014   Anticipated DC Plan:        DC Planning Services  CM consult      Choice offered to / List presented to:             Status of service:  In process, will continue to follow Medicare Important Message given?   (If response is "NO", the following Medicare IM given date fields will be blank) Date Medicare IM given:   Date Additional Medicare IM given:    Discharge Disposition:    Per UR Regulation:  Reviewed for med. necessity/level of care/duration of stay  If discussed at Long Length of Stay Meetings, dates discussed:    Comments:

## 2014-02-05 NOTE — Telephone Encounter (Signed)
Theodoro Grist, a PA at [facility], calling to state that patient missed their appointment because they couldn't get him in the car, Theodoro Grist believes he may have ALS and is wondering if they can have an EMG done at the hospital, if possible. Please call and advise.

## 2014-02-05 NOTE — Telephone Encounter (Signed)
I talked with Ethan Mclean. This patient may have ALS, but he has Medicare, Medicare does not cover EMG or nerve conduction study in the hospital. The patient will need a feeding tube, and he can return at a later date for the EMG evaluation.

## 2014-02-05 NOTE — Telephone Encounter (Signed)
Pt's NCV/EMG has been set up for tomorrow.

## 2014-02-05 NOTE — Telephone Encounter (Signed)
Dave, a PA at [facility], calling to state that patient missed their appointment because they couldn't get him in the car, Dave believes he may have ALS and is wondering if they can have an EMG done at the hospital, if possible. Please call and advise.   

## 2014-02-05 NOTE — Discharge Summary (Addendum)
Physician Discharge Summary  Ethan Mclean ZOX:[id number] DOB: [birth year] DOA: 02/04/2014  PCP: Lenora BoysFRIED, ROBERT L, MD  Admit date: 02/04/2014 Anticipated Discharge date: 02/06/2014  Time spent: 25 minutes  Recommendations for Outpatient Follow-up:  1. Patient will be discharged from hospital so that he will be able to get EMG and nerve conduction study 2. Blood pressure medications are being discontinued given worsening renal failure and poor by mouth intake and low blood pressures  Discharge Diagnoses:  Principal Problem:   Dysphagia Active Problems:   DYSLIPIDEMIA   Chronic systolic heart failure   Bilateral leg edema   BPH (benign prostatic hypertrophy) with urinary obstruction   Bilateral leg weakness   Bilateral arm weakness   Protein-calorie malnutrition, severe   ARF (acute renal failure)   Discharge Condition: Stable, unchanged  Diet recommendation: N.p.o.  Filed Weights   02/04/14 1417  Weight: 89.359 kg (197 lb)    History of present illness:  80 year old white male with past medical history of chronic systolic heart failure who in the past year has had progressively worsening loss of motor function of his lower and upper extremities and to the point now where he is unable to swallow. He was brought in from skilled nursing facility on 5/18 as a direct admission by his primary care physician for further evaluation to the hospitalist service  Hospital Course:  Principal Problem:   Dysphagia: High concern for ALS given the rapid progression decline of motor symptoms. Neurology consulted. To make diagnosis of ALS, patient will need EMG and nerve conduction studies not done in inpatient setting. Plan is for patient to have nerve conduction studies [facility] tomorrow[facility] 5[facility]20 [facility] Patient will be discharged from hospital prior to this Active Problems:   DYSLIPIDEMIA: Stable   Chronic systolic heart failure: BNP elevated, but with worsening renal  function, after giving one dose of Lasix we'll hold before doing anything further   Bilateral leg edema: Secondary to venous stasis   BPH (benign prostatic hypertrophy) with urinary obstruction   Bilateral leg weakness: As above   Bilateral arm weakness   Protein-calorie malnutrition, severe: Patient needs criteria for severe malnutrition the context of chronic illness as evidenced by by mouth 6 and 50% for approximately one month, moderate temporal wasting and severe subcutaneous fat loss in the upper arm region. Possible tube feeding candidate if that is decided   ARF (acute renal failure): Likely worsening from poor by mouth intake  Procedures: None  Consultations:  Neurology  Wound care  Nutrition  Discharge Exam: Filed Vitals:   02/06/14 0534  BP: 117/73  Pulse: 59  Temp: 98.2 F (36.8 C)  Resp: 18   Patient seen and examined today. No changes.   General: Alert and oriented x3, no acute distress Cardiovascular: Regular rate and rhythm, S1-S2 Respiratory: Clear to auscultation bilaterally  Discharge Instructions You were cared for by a hospitalist during your hospital stay. If you have any questions about your discharge medications or the care you received while you were in the hospital after you are discharged, you can call the unit and asked to speak with the hospitalist on call if the hospitalist that took care of you is not available. Once you are discharged, your primary care physician will handle any further medical issues. Please note that NO REFILLS for any discharge medications will be authorized once you are discharged, as it is imperative that you return to your primary care physician (or establish a relationship with  a primary care physician if you do not have one) for your aftercare needs so that they can reassess your need for medications and monitor your lab values.     Medication List    STOP taking these medications       carvedilol 25 MG tablet   Commonly known as:  COREG     furosemide 40 MG tablet  Commonly known as:  LASIX     lisinopril 20 MG tablet  Commonly known as:  PRINIVIL,ZESTRIL     terazosin 2 MG capsule  Commonly known as:  HYTRIN      TAKE these medications       acetaminophen 500 MG tablet  Commonly known as:  TYLENOL  Take 500 mg by mouth every other day as needed for mild pain.     aspirin EC 325 MG tablet  Take 325 mg by mouth every other day.     calcium carbonate 500 MG chewable tablet  Commonly known as:  TUMS - dosed in mg elemental calcium  Chew 250 mg by mouth every other day.     HYDROcodone-acetaminophen 5-325 MG per tablet  Commonly known as:  NORCO/VICODIN  Take 1 tablet by mouth every 6 (six) hours as needed for moderate pain.     loratadine 10 MG tablet  Commonly known as:  CLARITIN  Take 10 mg by mouth every evening.     LORazepam 0.5 MG tablet  Commonly known as:  ATIVAN  Take one tablet by mouth twice daily as needed for anxiety     OMEGA 3 PO  Take 2 capsules by mouth 2 (two) times daily.     omeprazole 20 MG tablet  Commonly known as:  PRILOSEC OTC  Take 20 mg by mouth every other day.     oxymetazoline 0.05 % nasal spray  Commonly known as:  AFRIN  Place 1 spray into both nostrils 2 (two) times daily as needed for congestion.     PROBIOTIC COLON SUPPORT Caps  Take 1 capsule by mouth daily.     psyllium 58.6 % packet  Commonly known as:  METAMUCIL  Take 1 packet by mouth daily.     sodium chloride 0.65 % Soln nasal spray  Commonly known as:  OCEAN  Place 1 spray into both nostrils as needed for congestion.     SYSTANE 0.4-0.3 % Soln  Generic drug:  Polyethyl Glycol-Propyl Glycol  Place 1 drop into both eyes 2 (two) times daily.       Allergies  Allergen Reactions  . Codeine Phosphate   . Iohexol      Desc: contrast induced renal insufficiency in past, entered 12/25/2004 bsw     . Sulfonamide Derivatives     unknown      The results of significant  diagnostics from this hospitalization (including imaging, microbiology, ancillary and laboratory) are listed below for reference.    Significant Diagnostic Studies: Ct Abdomen Pelvis Wo Contrast  01/11/2014   CLINICAL DATA:  Diarrhea, ileus, history diabetes, hypertension, CHF, colonic diverticulosis  EXAM: CT ABDOMEN AND PELVIS WITHOUT CONTRAST  TECHNIQUE: Multidetector CT imaging of the abdomen and pelvis was performed following the standard protocol without intravenous contrast. Sagittal and coronal MPR images reconstructed from axial data set. Patient drank dilute oral contrast for exam. IV contrast not administered due to history of Iohexol allergy.  COMPARISON:  CT abdomen 12/03/2003  FINDINGS: Small bibasilar pleural effusions and atelectasis.  Extensive atherosclerotic calcifications including coronary arteries.  IVC filter  noted.  Within limits of a nonenhanced exam, no focal abnormalities of the liver, spleen, pancreas, or adrenal glands.  Bilateral renal cortical atrophy with a mid right renal cyst 3.8 x 2.5 cm image 40.  No urinary tract calcification or dilatation.  Small supraumbilical ventral hernia containing fat, fascial defect 13 x 12 mm.  Prostatic enlargement, gland 6.5 x 5.0 x 6 8 cm.  Unremarkable bladder and ureters.  Sigmoid diverticulosis without evidence of diverticulitis.  Appendix surgically absent by history.  Nonspecific stranding identified at the inferior aspect of the right perinephric space and Zuckerkandl's fascia, nonspecific, with additional mild stranding seen within the presacral fat.  Question bowel wall thickening of ascending colon versus artifact from underdistention.  Stomach and remaining bowel loops unremarkable.  No mass, adenopathy, ascites or free air.  Bones demineralized.  Bilateral fatty replacement of the gluteus minimus.  IMPRESSION: Small bibasilar pleural effusions and atelectasis.  Small supraumbilical ventral hernia containing fat.  Prostatic enlargement.   Sigmoid diverticulosis without evidence of diverticulitis.  Questionable wall thickening of the ascending colon versus artifact from underdistention; unable to exclude colonic neoplasm or colitis; recommend correlation with colonoscopy to exclude tumor.   Electronically Signed   By: Ulyses Southward M.D.   On: 01/11/2014 19:03   Mr Laqueta Jean ZO Contrast  02/05/2014   CLINICAL DATA:  Difficulty swallowing.  Possible ALS.  EXAM: MRI HEAD WITHOUT AND WITH CONTRAST  TECHNIQUE: Multiplanar, multiecho pulse sequences of the brain and surrounding structures were obtained without and with intravenous contrast.  CONTRAST:  18mL MULTIHANCE GADOBENATE DIMEGLUMINE 529 MG/ML IV SOLN  COMPARISON:  None.  FINDINGS: No acute infarct.  No intracranial hemorrhage.  No intracranial MR findings of ALS.  Mild to moderate small vessel disease type changes.  Global atrophy without hydrocephalus.  No intracranial mass or abnormal enhancement.  Major intracranial vascular structures are patent.  Partial opacification mastoid air cells bilaterally without obstructing lesion noted in the region of the posterior superior nasopharynx causing eustachian tube dysfunction.  Mild transverse ligament hypertrophy. Minimal spur C3-4 with very mild spinal stenosis.  IMPRESSION: No acute infarct.  No intracranial MR findings of ALS.  Mild to moderate small vessel disease type changes.  Global atrophy without hydrocephalus.  No intracranial mass or abnormal enhancement.  Partial opacification mastoid air cells bilaterally   Electronically Signed   By: Bridgett Larsson M.D.   On: 02/05/2014 14:46   Dg Abd Acute W/chest  01/11/2014   CLINICAL DATA:  Diarrhea.  EXAM: ACUTE ABDOMEN SERIES (ABDOMEN 2 VIEW & CHEST 1 VIEW)  COMPARISON:  Two-view chest x-ray 06/22/2013  FINDINGS: The heart is enlarged. Atherosclerotic calcifications are again seen in the aortic. The lungs are clear.  Fluid levels are present on the upright images without significant bowel dilation.  There is no free air. An IVC filter is in place. Degenerative changes are noted in the lower lumbar spine.  IMPRESSION: 1. No acute cardiopulmonary disease. 2. Fluid levels without dilated bowel. This may represent a diffuse ileus.   Electronically Signed   By: Gennette Pac M.D.   On: 01/11/2014 15:58   Dg Swallowing Func-speech Pathology  01/30/2014   Gray Bernhardt, CCC-SLP     01/30/2014 12:42 PM Objective Swallowing Evaluation: Modified Barium Swallowing Study   Patient Details  Name: ARLEN DUPUIS MRN: [id number] Date of Birth: [birth year]  Today's Date: 01/30/2014 Time: 1105-1205 SLP Time Calculation (min): 60 min  Past Medical History:  Past Medical History  Diagnosis Date  .  CHF (congestive heart failure)   . DVT (deep venous thrombosis)     "legs"  . Complication of anesthesia     "slow to wake up after eye OR"  . Type II diabetes mellitus     "dx'd years ago; don't have it anymroe" (01/11/2014)  . GERD (gastroesophageal reflux disease)   . Gout   . H/O echocardiogram     nonischemi cardiomyopathy EF 40-45%   . Asymptomatic PVCs   . Hypertension   . Statin intolerance   . Balance problems   . Chronic renal insufficiency   . Carpal tunnel syndrome on right   . Gout   . Osteoarthritis   . H/O CHF W/ LE Edema  . Hx of adenomatous colonic polyps   . Chronic edema     LE from chronic  DVTS   Past Surgical History:  Past Surgical History  Procedure Laterality Date  . Appendectomy    . Vena cava filter placement    . Cataract extraction w/ intraocular lens implant Left   . Tonsillectomy    . Eye surgery Bilateral     "to retain tears"  . Cardiac catheterization  12/2001    Normal coronary arteries by cath [id number]   HPI:  80 year old male refered for outpatient MBS due to ~1 week  history of choking. Per caregiver, pt has history of  neuromuscular disorder affecting the lower extremity  Other PMH  is significant for CHF, DM, GERD (not on PPI), Gout.       Assessment / Plan / Recommendation Clinical Impression   Dysphagia Diagnosis: Severe pharyngeal phase dysphagia;Moderate  oral phase dysphagia Clinical impression: Limited po presentation during this study,  due to the fact that pt exhibited NO swallow reflex.  There was  no visible laryngeal or epiglottic movement, and reduced tongue  movement.  Pt reported "I just swallowed as hard as I could", but  nothing was visualized.  Once vallecular and pyriform sinuses  folled to capacity, spill into the laryngeal vestibule was noted.  Voice quality was noted to change, and pt began coughing and  clearing his throat.  Stasis was then suctioned from the  oropharynx and the study was terminated.  Of note, pt was unable  to raise eyebrows or close his eyes, and labial and lingual  movement were reduced. Palatal elevation appeared adequate, and  pt demonstrated gag reflex during oral suctioning.  Neurological  component is highly likely, and further workup by neurologist is  strongly encouraged.  Facial, Glossopharyngeal, Vagus, Spinal  Accessory and Hypoglossal nerves all appear to be adversely  affected, based on presentation, weakness, and  caregiver/pt  report.  Results were reviewed with pt and caregiver, who had  been communicating with pt's daughter throughout the MBS.   Caregiver was encouraged to contact PCP today (pt has appointment  tomorrow), as SLP is unable to recommend safe po diet at this  time.  Neurological consult is strongly recommended to determine  etiology of pt's deficits.  NPO status is recommended at this  time, with consideration of nonoral feeding method for safe and  adequate nutrition and hydration.    Treatment Recommendation  Defer treatment plan to SLP at (Comment) (pending neurological  consultation and results)    Diet Recommendation NPO   Medication Administration: Via alternative means    Other  Recommendations Recommended Consults: Other (Comment)  (Neurologist) Oral Care Recommendations: Oral care BID   Follow Up Recommendations  Frequency  and Duration   n/a     Pertinent Vitals/Pain VSS, no pain reported    SLP Swallow Goals   n/a at this time. Pending neurological workup/results and  recommendations   General Date of Onset: 01/18/14 HPI: 80 year old male refered for outpatient MBS due to ~1 week  history of choking. Per caregiver, pt has history of  neuromuscular disorder affecting the lower extremity  Other PMH  is significant for CHF, DM, GERD (not on PPI), Gout.   Type of Study: Modified Barium Swallowing Study Reason for Referral: Objectively evaluate swallowing function Previous Swallow Assessment: n/a Diet Prior to this Study: Regular;Thin liquids Temperature Spikes Noted: No Respiratory Status: Room air History of Recent Intubation: No Behavior/Cognition: Alert;Cooperative;Pleasant  mood;Confused;Requires cueing Oral Cavity - Dentition: Adequate natural dentition Oral Motor / Sensory Function: Impaired motor;Impaired sensory Oral impairment: Right facial;Left facial;Right lingual;Left  lingual;Right labial;Left labial Self-Feeding Abilities: Needs assist Patient Positioning: Upright in chair Baseline Vocal Quality: Clear Volitional Cough: Weak Volitional Swallow: Unable to elicit Anatomy: Within functional limits Pharyngeal Secretions: Not observed secondary MBS (Pt reports  globus sensation of phlegm)    Reason for Referral Objectively evaluate swallowing function   Oral Phase Oral Preparation/Oral Phase Oral Phase: Impaired Oral - Nectar Oral - Nectar Teaspoon: Weak lingual manipulation;Lingual  pumping;Reduced posterior propulsion;Delayed oral transit Oral - Thin Oral - Thin Teaspoon: Weak lingual manipulation;Lingual  pumping;Reduced posterior propulsion;Delayed oral transit Oral - Solids Oral - Puree: Reduced posterior propulsion;Delayed oral  transit;Weak lingual manipulation;Lingual pumping   Pharyngeal Phase Pharyngeal Phase Pharyngeal Phase: Impaired Pharyngeal - Nectar Pharyngeal - Nectar Teaspoon: Premature spillage to   valleculae;Premature spillage to pyriform  sinuses;Penetration/Aspiration before swallow;Reduced  airway/laryngeal closure;Reduced epiglottic inversion;Reduced  pharyngeal peristalsis;Reduced tongue base retraction;Delayed  swallow initiation;Reduced laryngeal elevation Penetration/Aspiration details (nectar teaspoon): Material enters  airway, remains ABOVE vocal cords and not ejected out Pharyngeal - Thin Pharyngeal - Thin Teaspoon: Penetration/Aspiration before  swallow;Premature spillage to valleculae;Premature spillage to  pyriform sinuses;Reduced airway/laryngeal closure;Reduced  laryngeal elevation;Reduced anterior laryngeal mobility;Reduced  epiglottic inversion;Reduced pharyngeal peristalsis;Reduced  tongue base retraction;Delayed swallow initiation Penetration/Aspiration details (thin teaspoon): Material enters  airway, remains ABOVE vocal cords and not ejected out Pharyngeal - Solids Pharyngeal - Puree: Penetration/Aspiration before  swallow;Premature spillage to valleculae;Premature spillage to  pyriform sinuses;Reduced airway/laryngeal closure;Reduced  laryngeal elevation;Reduced anterior laryngeal mobility;Reduced  epiglottic inversion;Delayed swallow initiation;Reduced  pharyngeal peristalsis;Reduced tongue base retraction Penetration/Aspiration details (puree): Material enters airway,  remains ABOVE vocal cords and not ejected out Pharyngeal Phase - Comment Pharyngeal Comment: ABSENT SWALLOW REFLEX, NO ANTERIOR LARYNGEAL  MOVEMENT/ELEVATION  Cervical Esophageal Phase    GO    Cervical Esophageal Phase Cervical Esophageal Phase:  (NOT OBSERVED)    Functional Assessment Tool Used: Clinical judgment andd objective  evaluation of swallow, ASHA noms Functional Limitations: Swallowing Swallow Current Status (W0981): 100 percent impaired, limited or  restricted Swallow Goal Status (X9147): 100 percent impaired, limited or  restricted Swallow Discharge Status (W2956): 100 percent impaired, limited  or  restricted   Celia B. Murvin [facility], CCC-SLP [phone]  Gray Bernhardt 01/30/2014, 12:41 PM     Microbiology: No results found for this or any previous visit (from the past 240 hour(s)).   Labs: Basic Metabolic Panel:  Recent Labs Lab 02/04/14 1809 02/06/14 0353  NA 134* 138  K 4.4 4.3  CL 98 101  CO2 22 22  GLUCOSE 95 75  BUN 58* 55*  CREATININE 1.42* 1.40*  CALCIUM 8.7 8.9   Liver  Function Tests: No results found for this basename: AST, ALT, ALKPHOS, BILITOT, PROT, ALBUMIN,  in the last 168 hours No results found for this basename: LIPASE, AMYLASE,  in the last 168 hours No results found for this basename: AMMONIA,  in the last 168 hours CBC:  Recent Labs Lab 02/04/14 1809  WBC 9.0  HGB 12.7*  HCT 36.4*  MCV 91.5  PLT 104*   Cardiac Enzymes: No results found for this basename: CKTOTAL, CKMB, CKMBINDEX, TROPONINI,  in the last 168 hours BNP: BNP (last 3 results)  Recent Labs  01/11/14 1444 01/15/14 0700 02/04/14 1809  PROBNP 9946.0* 2541.0* 5728.0*   CBG: No results found for this basename: GLUCAP,  in the last 168 hours     Signed:  Hamza Empson Otelia Sergeant  [facility] 02/06/2014, 9:49 AM

## 2014-02-05 NOTE — Progress Notes (Signed)
INITIAL NUTRITION ASSESSMENT  DOCUMENTATION CODES Per approved criteria  -Severe malnutrition in the context of chronic illness  Pt meets criteria for severe MALNUTRITION in the context of chronic illness as evidenced by PO intake <50% for approximately one month, moderate temporal wasting and severe subcutaneous fat loss in upper arm region   INTERVENTION: -Nutrition support initiation per MD -Will continue to monitor GOC   NUTRITION DIAGNOSIS: Inadequate oral intake related to inability to eat as evidenced by NPO status.   Goal: Pt to meet >/= 90% of their estimated nutrition needs    Monitor:  TF access/orders, total protein/energy intake, labs, weights, swallow profile, skin integrity  Reason for Assessment: Low Braden  80 y.o. male  Admitting Dx: <principal problem not specified>  ASSESSMENT: Patient in the last year has had progressively worsening loss of motor function of his lower extremities to the point now where he could barely move his feet. This has not affected his sensation. In addition, he also had progressively worsening decline in upper extremity function, but still has some strength and loss of fine motor control and decreasing gross motor control. When seen his PCP today, did not eat in some time. His primary care physician reached out and had him directly admitted to the hospitalist service for formal neurological evaluation of the cause of his swallowing and overall decline in motor function as well as possible comfort care versus PEG tube placement depending on outcome.  -Pt's wife reported period of suboptimal intake d/t inability to swallow. Diet largely consisted of soft foods such as eggs, applesauce and occasionally protein supplement such as Boost/Ensure. Has been at SNF for past 3 weeks since d/c in 12/2013.  -Wife has noticed pt with evident subcutaneous fat loss and muscle wasting in upper extremities, but was unable to quantify amount. MD also noted  temporal wasting. -Reported usual body weight around 197 lbs. Current weight likely elevated d/t +4 edema in RLE and LLE. -Pt currently NPO. Undergoing brain MRI during time of RD visit. MD noted pt with possible ALS. Will likely place NGT -RD to place TF recommendations to utilize as warranted  -WOC evaluated pt on 5/18 for stage 2 sacral pressure ulcer and IAD  Height: Ht Readings from Last 1 Encounters:  02/04/14 6' 3.5" (1.918 m)    Weight: Wt Readings from Last 1 Encounters:  02/04/14 197 lb (89.359 kg)    Ideal Body Weight: 202 lbs  % Ideal Body Weight: 98%  Wt Readings from Last 10 Encounters:  02/04/14 197 lb (89.359 kg)  01/28/14 186 lb 9.6 oz (84.641 kg)  01/16/14 191 lb (86.637 kg)  01/13/14 203 lb 4.8 oz (92.216 kg)    Usual Body Weight: 197 lbs  % Usual Body Weight: 100%  BMI:  Body mass index is 24.29 kg/(m^2).  Estimated Nutritional Needs: Kcal: 2000-2200 Protein: 105-115 gram Fluid: 1.5-1.8L/daily  Skin: stage 2 sacral pressure ulcer, IAD  Diet Order: NPO  EDUCATION NEEDS: -No education needs identified at this time   Intake/Output Summary (Last 24 hours) at 02/05/14 1351 Last data filed at 02/05/14 1226  Gross per 24 hour  Intake      0 ml  Output    200 ml  Net   -200 ml    Last BM: 5/11  Labs:   Recent Labs Lab 02/04/14 1809  NA 134*  K 4.4  CL 98  CO2 22  BUN 58*  CREATININE 1.42*  CALCIUM 8.7  GLUCOSE 95    CBG (  last 3)  No results found for this basename: GLUCAP,  in the last 72 hours  Scheduled Meds: . furosemide  20 mg Intravenous Q12H  . LORazepam  0.5 mg Oral QHS  . sodium chloride  3 mL Intravenous Q12H    Continuous Infusions:   Past Medical History  Diagnosis Date  . CHF (congestive heart failure)   . DVT (deep venous thrombosis)     "legs"  . Complication of anesthesia     "slow to wake up after eye OR"  . Type II diabetes mellitus     "dx'd years ago; don't have it anymroe" (01/11/2014)  . GERD  (gastroesophageal reflux disease)   . Gout   . H/O echocardiogram     nonischemi cardiomyopathy EF 40-45%   . Asymptomatic PVCs   . Hypertension   . Statin intolerance   . Balance problems   . Chronic renal insufficiency   . Carpal tunnel syndrome on right   . Gout   . Osteoarthritis   . H/O CHF W/ LE Edema  . Hx of adenomatous colonic polyps   . Chronic edema     LE from chronic  DVTS    Past Surgical History  Procedure Laterality Date  . Appendectomy    . Vena cava filter placement    . Cataract extraction w/ intraocular lens implant Left   . Tonsillectomy    . Eye surgery Bilateral     "to retain tears"  . Cardiac catheterization  12/2001    Normal coronary arteries by cath 2003    Lloyd HugerSarah F Emani Taussig MS RD LDN Clinical Dietitian Pager:[phone]

## 2014-02-05 NOTE — Progress Notes (Signed)
NEURO HOSPITALIST PROGRESS NOTE   SUBJECTIVE:                                                                                                                         No change over night. Patient and family very happy with care and eager to find etiology.  OBJECTIVE:                                                                                                                           Vital signs in last 24 hours: Temp:  [97.4 F (36.3 C)-97.7 F (36.5 C)] 97.5 F (36.4 C) (05/19 0500) Pulse Rate:  [43-96] 96 (05/19 0608) Resp:  [18] 18 (05/19 0500) BP: (70-113)/(50-73) 100/66 mmHg (05/19 0608) SpO2:  [96 %-97 %] 97 % (05/19 0500) Weight:  [89.359 kg (197 lb)] 89.359 kg (197 lb) (05/18 1417)  Intake/Output from previous day: 05/18 0701 - 05/19 0700 In: 0  Out: 200 [Urine:200] Intake/Output this shift:   Nutritional status: NPO  Past Medical History  Diagnosis Date  . CHF (congestive heart failure)   . DVT (deep venous thrombosis)     "legs"  . Complication of anesthesia     "slow to wake up after eye OR"  . Type II diabetes mellitus     "dx'd years ago; don't have it anymroe" (01/11/2014)  . GERD (gastroesophageal reflux disease)   . Gout   . H/O echocardiogram     nonischemi cardiomyopathy EF 40-45%   . Asymptomatic PVCs   . Hypertension   . Statin intolerance   . Balance problems   . Chronic renal insufficiency   . Carpal tunnel syndrome on right   . Gout   . Osteoarthritis   . H/O CHF W/ LE Edema  . Hx of adenomatous colonic polyps   . Chronic edema     LE from chronic  DVTS     Neurologic Exam:  Mental Status:  Alert, oriented, thought content appropriate. Speech fluent without evidence of aphasia. Able to follow commands without difficulty.  Cranial Nerves:  II-Visual fields were normal.  III/IV/VI-Pupils were equal and reacted. Extraocular movements were full and conjugate.  V/VII-no facial numbness; severe  upper facial weakness with moderately severe  weakness of orbicularis oculi as well as moderately severe bilateral lower facial weakness. Bilateral temporal wasting noted. Patient had no weakness of muscles of mastication.  VIII-normal.  X-moderately dysarthric speech; symmetrical palatal movement.  XII-midline tongue extension; no clear atrophy.  Motor: Moderately severe weakness of neck flexors; moderate weakness proximally of both upper extremities with severe weakness distally. Mild to moderate atrophy of upper extremities proximally as well as moderately severe atrophy distally. Patient had severe weakness of lower extremities as well, proximally and distally. Marked edema of feet noted. No fasciculations were noted. Muscle tone was flaccid throughout.  Sensory: Normal throughout.  Deep Tendon Reflexes: Absent in upper and lower extremities; no clear frontal release signs.  Plantars: Mute bilaterally  Cerebellar: Normal finger-to-nose testing bilaterally   Lab Results: Basic Metabolic Panel:  Recent Labs Lab 02/04/14 1809  NA 134*  K 4.4  CL 98  CO2 22  GLUCOSE 95  BUN 58*  CREATININE 1.42*  CALCIUM 8.7    Liver Function Tests: No results found for this basename: AST, ALT, ALKPHOS, BILITOT, PROT, ALBUMIN,  in the last 168 hours No results found for this basename: LIPASE, AMYLASE,  in the last 168 hours No results found for this basename: AMMONIA,  in the last 168 hours  CBC:  Recent Labs Lab 02/04/14 1809  WBC 9.0  HGB 12.7*  HCT 36.4*  MCV 91.5  PLT 104*    Cardiac Enzymes: No results found for this basename: CKTOTAL, CKMB, CKMBINDEX, TROPONINI,  in the last 168 hours  Lipid Panel: No results found for this basename: CHOL, TRIG, HDL, CHOLHDL, VLDL, LDLCALC,  in the last 168 hours  CBG: No results found for this basename: GLUCAP,  in the last 168 hours  Microbiology: Results for orders placed during the hospital encounter of 01/11/14  CLOSTRIDIUM DIFFICILE  BY PCR     Status: None   Collection Time    01/11/14  6:36 PM      Result Value Ref Range Status   C difficile by pcr NEGATIVE  NEGATIVE Final  STOOL CULTURE     Status: None   Collection Time    01/11/14  6:36 PM      Result Value Ref Range Status   Specimen Description STOOL   Final   Special Requests NONE   Final   Culture     Final   Value: NO SALMONELLA, SHIGELLA, CAMPYLOBACTER, YERSINIA, OR E.COLI 0157:H7 ISOLATED     Performed at [facility]   Report Status 01/15/2014 FINAL   Final  OVA AND PARASITE EXAMINATION     Status: None   Collection Time    01/11/14  6:36 PM      Result Value Ref Range Status   Specimen Description STOOL   Final   Special Requests NONE   Final   Ova and parasites     Final   Value: NO OVA OR PARASITES SEEN MODERATE YEAST     Performed at [facility]   Report Status 01/15/2014 FINAL   Final    Coagulation Studies: No results found for this basename: LABPROT, INR,  in the last 72 hours  Imaging: No results found.     MEDICATIONS  Scheduled: . furosemide  20 mg Intravenous Q12H  . LORazepam  0.5 mg Oral QHS  . sodium chloride  3 mL Intravenous Q12H    ASSESSMENT/PLAN:                                                                                                            Progressive neuromuscular disorder over several years with severe weakness of lower extremities as well as severe weakness distally of upper extremities and moderate weakness proximally of upper extremities. In addition patient has developed severe dysphagia and dysarthria which have been progressive, as well. Etiology is unclear. Progressive motor neuron disease will need to be ruled out. In addition, given the patient's temporal wasting and severe upper facial weakness, myotonic dystrophy we'll also need to be ruled out.  I have spoken  with Dr. Anne HahnWillis and best alternative is likely to feed him with a NG tube, get him stronger and then have immediate follow up with [facility].  IF not possible then [facility] from hospital. I have spoken to Dr. Rito EhrlichKrishnan and relayed this information.   Will follow MRI brain.  Felicie MornDavid Chanele Douglas PA-C [facility] [phone]  02/05/2014, 9:47 AM     Assessment and plan discussed with with attending physician and they are in agreement.    Felicie MornDavid Khris Jansson PA-C [facility] [phone]  02/05/2014, 9:30 AM

## 2014-02-06 ENCOUNTER — Encounter (INDEPENDENT_AMBULATORY_CARE_PROVIDER_SITE_OTHER): Payer: Self-pay | Admitting: Radiology

## 2014-02-06 ENCOUNTER — Inpatient Hospital Stay (HOSPITAL_COMMUNITY)
Admission: AD | Admit: 2014-02-06 | Discharge: 2014-02-13 | Disposition: A | Discharge status: Other Acute Inpatient Hospital | Payer: Medicare Other | Source: Ambulatory Visit | Attending: Internal Medicine | Admitting: Internal Medicine

## 2014-02-06 ENCOUNTER — Ambulatory Visit (INDEPENDENT_AMBULATORY_CARE_PROVIDER_SITE_OTHER): Payer: Medicare Other | Admitting: Neurology

## 2014-02-06 ENCOUNTER — Encounter (HOSPITAL_COMMUNITY): Payer: Self-pay | Admitting: *Deleted

## 2014-02-06 DIAGNOSIS — R29898 Other symptoms and signs involving the musculoskeletal system: Secondary | ICD-10-CM

## 2014-02-06 DIAGNOSIS — I10 Essential (primary) hypertension: Secondary | ICD-10-CM

## 2014-02-06 DIAGNOSIS — R1310 Dysphagia, unspecified: Secondary | ICD-10-CM

## 2014-02-06 DIAGNOSIS — E785 Hyperlipidemia, unspecified: Secondary | ICD-10-CM | POA: Diagnosis present

## 2014-02-06 DIAGNOSIS — Z0289 Encounter for other administrative examinations: Secondary | ICD-10-CM

## 2014-02-06 DIAGNOSIS — R600 Localized edema: Secondary | ICD-10-CM

## 2014-02-06 DIAGNOSIS — I5022 Chronic systolic (congestive) heart failure: Secondary | ICD-10-CM

## 2014-02-06 DIAGNOSIS — G729 Myopathy, unspecified: Secondary | ICD-10-CM

## 2014-02-06 DIAGNOSIS — Z515 Encounter for palliative care: Secondary | ICD-10-CM

## 2014-02-06 DIAGNOSIS — M609 Myositis, unspecified: Secondary | ICD-10-CM

## 2014-02-06 DIAGNOSIS — R531 Weakness: Secondary | ICD-10-CM

## 2014-02-06 DIAGNOSIS — I5023 Acute on chronic systolic (congestive) heart failure: Secondary | ICD-10-CM

## 2014-02-06 DIAGNOSIS — G608 Other hereditary and idiopathic neuropathies: Secondary | ICD-10-CM

## 2014-02-06 DIAGNOSIS — D693 Immune thrombocytopenic purpura: Secondary | ICD-10-CM

## 2014-02-06 DIAGNOSIS — G629 Polyneuropathy, unspecified: Secondary | ICD-10-CM | POA: Diagnosis present

## 2014-02-06 DIAGNOSIS — G6289 Other specified polyneuropathies: Secondary | ICD-10-CM

## 2014-02-06 DIAGNOSIS — E43 Unspecified severe protein-calorie malnutrition: Secondary | ICD-10-CM

## 2014-02-06 LAB — BASIC METABOLIC PANEL
BUN: 55 mg/dL — ABNORMAL HIGH (ref 6–23)
CO2: 22 meq/L (ref 19–32)
Calcium: 8.9 mg/dL (ref 8.4–10.5)
Chloride: 101 mEq/L (ref 96–112)
Creatinine, Ser: 1.4 mg/dL — ABNORMAL HIGH (ref 0.50–1.35)
GFR calc Af Amer: 51 mL/min — ABNORMAL LOW (ref >90)
GFR calc non Af Amer: 44 mL/min — ABNORMAL LOW (ref >90)
GLUCOSE: 75 mg/dL (ref 70–99)
POTASSIUM: 4.3 meq/L (ref 3.7–5.3)
Sodium: 138 mEq/L (ref 137–147)

## 2014-02-06 LAB — CK: CK TOTAL: 57 U/L (ref 7–232)

## 2014-02-06 MED ORDER — ALBUTEROL SULFATE (2.5 MG/3ML) 0.083% IN NEBU: 2.5000 mg | INHALATION_SOLUTION | Freq: Four times a day (QID) | RESPIRATORY_TRACT | Status: DC | PRN

## 2014-02-06 MED ORDER — DEXTROSE-NACL 5-0.45 % IV SOLN
INTRAVENOUS | Status: DC
  Administered 2014-02-06: 19:00:00 via INTRAVENOUS

## 2014-02-06 MED ORDER — LORAZEPAM 2 MG/ML IJ SOLN
0.5000 mg | Freq: Every day | INTRAMUSCULAR | Status: DC
  Administered 2014-02-06: 0.5 mg via INTRAVENOUS
  Filled 2014-02-06: qty 1

## 2014-02-06 NOTE — Procedures (Signed)
     HISTORY:  Ethan Mclean is an 80 year old gentleman with a progressive history of weakness over the last 4 years. The patient has had ongoing issues with swallowing over the last one year, and within the last several weeks, he has essentially been unable to swallow at all, associated with a nasal quality speech. The patient is nonambulatory. He reports numbness up to the mid thigh level in both lower extremities. He is being evaluated for a possible neuropathy or myopathy.  NERVE CONDUCTION STUDIES:  Nerve conduction studies were performed on all 4 extremities. The distal motor latencies for the median nerves were normal on the right, prolonged on the left, with low motor amplitudes for these nerves bilaterally. The distal motor latencies for the ulnar nerves were normal the right, prolonged on the left, with low motor amplitudes for these nerves bilaterally. The F wave latencies for the median nerves were normal on the right, borderline normal on the left, and prolonged for the ulnar nerves bilaterally. Nerve conduction velocities for the median and ulnar nerves were normal bilaterally. The sensory latencies for the median nerves were normal bilaterally. The ulnar sensory latencies were borderline normal on the right, slightly prolonged the left, and prolonged for the right radial nerve.  Nerve conduction studies were performed on both lower extremities. Responses for the peroneal and posterior tibial nerves were unobtainable bilaterally. The peroneal sensory latencies were unobtainable bilaterally.  EMG STUDIES:  EMG study was performed on the right upper extremity:  The first dorsal interosseous muscle reveals 2 to  K units with decreased recruitment. No fibrillations or positive waves were noted. The abductor pollicis brevis muscle reveals 2 to 3 K units with decreased recruitment. No fibrillations or positive waves were noted. The extensor indicis proprius muscle reveals 1 to 3 K units  with full recruitment. No fibrillations or positive waves were noted. The biceps muscle reveals 0.5 to 1 K units with full recruitment. 2+  positive waves were noted. The triceps muscle reveals 0.5 to 1.5 K units with full recruitment. One plus positive waves were noted. The anterior deltoid muscle reveals 0.5 to 2 K units with full recruitment. 2+  positive waves were noted.  EMG study was performed on the right lower extremity:  The tibialis anterior muscle reveals 2 to 4K motor units with moderately reduced recruitment. No fibrillations or positive waves were seen. The peroneus tertius muscle reveals no voluntary motor units with no recruitment. No fibrillations or positive waves were seen. The medial gastrocnemius muscle reveals no voluntary motor units with no recruitment. No fibrillations or positive waves were seen. The vastus lateralis muscle reveals 0.5 to 1K motor units with full recruitment. One plus positive waves were seen. The iliopsoas muscle reveals 0.5 to 1K motor units with full recruitment. 2+ positive waves were seen.   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 The right lower extremity is affected much more severely than the right upper extremity. If muscle biopsy is contemplated, would recommend the deltoid muscle or the biceps muscle. There is no evidence of an anterior horn cell disease process.  Marlan Palau MD 02/06/2014 4:24 PM  [facility] [address] [place], [place] [address]  Phone [phone] Fax [phone]

## 2014-02-06 NOTE — Progress Notes (Signed)
Clinical Social Work Department BRIEF PSYCHOSOCIAL ASSESSMENT 02/06/2014  Patient:  Ethan Mclean, Ethan Mclean     Account Number:  [id number]     Admit date:  02/04/2014  Clinical Social Worker:  Earlie Server  Date/Time:  02/06/2014 10:00 AM  Referred by:  Physician  Date Referred:  02/06/2014 Referred for  Transportation assistance   Other Referral:   Interview type:  Patient Other interview type:    PSYCHOSOCIAL DATA Living Status:  FAMILY Admitted from facility:   Level of care:   Primary support name:  Curt Bears Primary support relationship to patient:  SPOUSE Degree of support available:   Adequate    CURRENT CONCERNS Current Concerns  Other - See comment   Other Concerns:   Transportation for procedure    SOCIAL WORK ASSESSMENT / PLAN Patient was discussed during progression meeting and MD reports patient needs transportation to procedure and then will return to the hospital.    CSW reviewed chart and met with patient and wife at bedside. CSW introduced myself and explained role. CSW inquired about patient's transportation PTA. Patient and wife use a caregiver and has assistance with transportation but prefer non-emergency ambulance to transport patient to procedure. CSW spoke with [facility] who is unable to provide transportation but spoke with [facility] who is agreeable to provide transportation to and from procedure. CSW explained to patient and wife of no guarantee of payment through insurance and they report understanding but still want [facility] to provide transportation.    CSW completed [facility] forms and set up transportation for 1:30pm pick up. CSW spoke with [facility] who will call [facility] to transport back to the hospital after procedure is completed. CSW called and left a message with patient's insurance to inquire if they would cover ambulance transportation.  CSW is signing off but available if further needs arise.   Assessment/plan status:  No Further Intervention  Required Other assessment/ plan:   Information/referral to community resources:   [facility]    PATIENT'S/FAMILY'S RESPONSE TO PLAN OF CARE: Patient alert and oriented. Patient and wife reports they are not concerned about money and just want to ensure that patient is focused on getting better. Patient reports that he does not feel safe with any other transportation other than [facility] for appointment. Patient and wife agreeable to plans.       [place], [place] [phone]

## 2014-02-06 NOTE — Progress Notes (Signed)
Subjective: NO complaints.  patient comfortable.  [facility] and then we will be following after he returns to hospital. Wife ad husband anxious about transportation and test.   Objective: Current vital signs: BP 112/75  Pulse 70  Temp(Src) 98.6 F (37 C) (Oral)  Resp 18  Ht 6' 3.5" (1.918 m)  Wt 89.359 kg (197 lb)  BMI 24.29 kg/m2  SpO2 99% Vital signs in last 24 hours: Temp:  [97.4 F (36.3 C)-98.6 F (37 C)] 98.6 F (37 C) (05/20 1003) Pulse Rate:  [59-91] 70 (05/20 1003) Resp:  [18] 18 (05/20 1003) BP: (95-117)/(61-75) 112/75 mmHg (05/20 1003) SpO2:  [96 %-100 %] 99 % (05/20 1003)  Intake/Output from previous day: 05/19 0701 - 05/20 0700 In: 0  Out: 800 [Urine:800] Intake/Output this shift:   Nutritional status: NPO  Neurologic Exam: Mental Status: Alert, oriented, thought content appropriate.  Speech fluent without evidence of aphasia.  Able to follow 3 step commands without difficulty. Cranial Nerves: II: Discs flat bilaterally; Visual fields grossly normal, pupils equal, round, reactive to light and accommodation III,IV, VI: ptosis not present, extra-ocular motions intact bilaterally V,VII: continues to have weakness of upper face and orbicularis oculi along with lower face.  VIII: hearing normal bilaterally IX,X: gag reflex present XI: bilateral shoulder shrug XII: midline tongue extension without atrophy or fasciculations  Motor: Weak neck flexors and weakness of bilateral UE with distal>proximal. Mild to moderate atrophy of upper extremities proximally as well as moderately severe atrophy distally. Patient had severe weakness of lower extremities as well, proximally and distally.  Again, no fasciculations were noted. Muscle tone was flaccid throughout.   Sensory: Pinprick and light touch intact throughout, bilaterally Deep Tendon Reflexes:  0/4 in UE and LE  Plantars: Mute bilaterally    Lab Results: Basic  Metabolic Panel:  Recent Labs Lab 02/04/14 1809 02/06/14 0353  NA 134* 138  K 4.4 4.3  CL 98 101  CO2 22 22  GLUCOSE 95 75  BUN 58* 55*  CREATININE 1.42* 1.40*  CALCIUM 8.7 8.9    Liver Function Tests: No results found for this basename: AST, ALT, ALKPHOS, BILITOT, PROT, ALBUMIN,  in the last 168 hours No results found for this basename: LIPASE, AMYLASE,  in the last 168 hours No results found for this basename: AMMONIA,  in the last 168 hours  CBC:  Recent Labs Lab 02/04/14 1809  WBC 9.0  HGB 12.7*  HCT 36.4*  MCV 91.5  PLT 104*    Cardiac Enzymes: No results found for this basename: CKTOTAL, CKMB, CKMBINDEX, TROPONINI,  in the last 168 hours  Lipid Panel: No results found for this basename: CHOL, TRIG, HDL, CHOLHDL, VLDL, LDLCALC,  in the last 168 hours  CBG: No results found for this basename: GLUCAP,  in the last 168 hours  Microbiology: Results for orders placed during the hospital encounter of 01/11/14  CLOSTRIDIUM DIFFICILE BY PCR     Status: None   Collection Time    01/11/14  6:36 PM      Result Value Ref Range Status   C difficile by pcr NEGATIVE  NEGATIVE Final  STOOL CULTURE     Status: None   Collection Time    01/11/14  6:36 PM      Result Value Ref Range Status   Specimen Description STOOL   Final   Special Requests NONE   Final   Culture     Final   Value: NO  SALMONELLA, SHIGELLA, CAMPYLOBACTER, YERSINIA, OR E.COLI 0157:H7 ISOLATED     Performed at [facility]   Report Status 01/15/2014 FINAL   Final  OVA AND PARASITE EXAMINATION     Status: None   Collection Time    01/11/14  6:36 PM      Result Value Ref Range Status   Specimen Description STOOL   Final   Special Requests NONE   Final   Ova and parasites     Final   Value: NO OVA OR PARASITES SEEN MODERATE YEAST     Performed at [facility]   Report Status 01/15/2014 FINAL   Final    Coagulation Studies: No results found for this basename: LABPROT, INR,  in  the last 72 hours  Imaging: Mr Laqueta Jean Wo Contrast  02/05/2014   CLINICAL DATA:  Difficulty swallowing.  Possible ALS.  EXAM: MRI HEAD WITHOUT AND WITH CONTRAST  TECHNIQUE: Multiplanar, multiecho pulse sequences of the brain and surrounding structures were obtained without and with intravenous contrast.  CONTRAST:  31mL MULTIHANCE GADOBENATE DIMEGLUMINE 529 MG/ML IV SOLN  COMPARISON:  None.  FINDINGS: No acute infarct.  No intracranial hemorrhage.  No intracranial MR findings of ALS.  Mild to moderate small vessel disease type changes.  Global atrophy without hydrocephalus.  No intracranial mass or abnormal enhancement.  Major intracranial vascular structures are patent.  Partial opacification mastoid air cells bilaterally without obstructing lesion noted in the region of the posterior superior nasopharynx causing eustachian tube dysfunction.  Mild transverse ligament hypertrophy. Minimal spur C3-4 with very mild spinal stenosis.  IMPRESSION: No acute infarct.  No intracranial MR findings of ALS.  Mild to moderate small vessel disease type changes.  Global atrophy without hydrocephalus.  No intracranial mass or abnormal enhancement.  Partial opacification mastoid air cells bilaterally   Electronically Signed   By: Bridgett Larsson M.D.   On: 02/05/2014 14:46    Medications:  Scheduled: . LORazepam  0.5 mg Oral QHS  . sodium chloride  3 mL Intravenous Q12H    Assessment/Plan: Progressive neuromuscular disorder over several years with severe weakness of lower extremities as well as severe weakness distally of upper extremities and moderate weakness proximally of upper extremities. In addition patient has developed severe dysphagia and dysarthria which have been progressive, as well. Etiology is unclear. Progressive motor neuron disease will need to be ruled out. In addition, given the patient's temporal wasting and severe upper facial weakness, myotonic dystrophy we'll also need to be ruled out.  Patient  will be transported to [facility] today to have a EMG/NCV test by Dr. Anne Hahn.  He will then be transported back to hospital and we will continue to follow.   MRI was negative for acute infarct or findings of ALS.  Will continue to follow.      Felicie Morn PA-C [facility] [phone]  02/06/2014, 10:13 AM

## 2014-02-06 NOTE — H&P (Signed)
History and Physical     Ethan BaconDewey C Mclean NGE:[id number] DOB: [birth year] DOA: 02/06/2014    PCP: Lenora BoysFRIED, ROBERT L, MD  Specialists: Neurology, Surgery   Chief Complaint: weakness   HPI: Ethan Mclean is a 80 y.o. male has a past medical history significant for systolic heart failure (EF 25-30% on 4/26),  HTN, DM, admitted after had a nerve conduction study in the [facility] office. He was discharged from the hospital service approximately 3 weeks ago after a hospitalization for diarrhea, went to a SNF where he was found to have inability to initiate swallowing function based on a MBS study. He was supposed to see neurology as an outpatient however was unable to get there. As previously described in Dr. Chancy MilroyKrishnan's H&P, Ethan Mclean had progressively worsening loss of motor function of his lower extremities to the point now where he could barely move his feet. This has not affected his sensation. He also noticed progressively worsening decline in upper extremity function, but still has some strength and loss of fine motor control and decreasing gross motor control.  [facility] and is now back.  Review of Systems: as per HPI otherwise negative  Past Medical History  Diagnosis Date  . CHF (congestive heart failure)   . DVT (deep venous thrombosis)     "legs"  . Complication of anesthesia     "slow to wake up after eye OR"  . Type II diabetes mellitus     "dx'd years ago; don't have it anymroe" (01/11/2014)  . GERD (gastroesophageal reflux disease)   . Gout   . H/O echocardiogram     nonischemi cardiomyopathy EF 40-45%   . Asymptomatic PVCs   . Hypertension   . Statin intolerance   . Balance problems   . Chronic renal insufficiency   . Carpal tunnel syndrome on right   . Gout   . Osteoarthritis   . H/O CHF W/ LE Edema  . Hx of adenomatous colonic polyps   . Chronic edema     LE from chronic  DVTS   Past Surgical History    Procedure Laterality Date  . Appendectomy    . Vena cava filter placement    . Cataract extraction w/ intraocular lens implant Left   . Tonsillectomy    . Eye surgery Bilateral     "to retain tears"  . Cardiac catheterization  12/2001    Normal coronary arteries by cath 2003   Social History:  reports that he quit smoking about 62 years ago. His smoking use included Cigarettes. He smoked 0.00 packs per day for 5 years. He has never used smokeless tobacco. He reports that he drinks alcohol. He reports that he does not use illicit drugs.  Allergies  Allergen Reactions  . Codeine Phosphate   . Iohexol      Desc: contrast induced renal insufficiency in past, entered 12/25/2004 bsw     . Sulfonamide Derivatives     unknown    Family History  Problem Relation Age of Onset  . Cancer Other    Prior to Admission medications   Medication Sig Start Date End Date Taking? Authorizing Provider  acetaminophen (TYLENOL) 500 MG tablet Take 500 mg by mouth every other day as needed for mild pain.    Historical Provider, MD  aspirin EC 325 MG tablet Take 325 mg by mouth every other day.    Historical Provider, MD  calcium carbonate (TUMS - DOSED IN MG ELEMENTAL CALCIUM) 500 MG chewable tablet Chew 250 mg by mouth every other day.    Historical Provider, MD  HYDROcodone-acetaminophen (NORCO/VICODIN) 5-325 MG per tablet Take 1 tablet by mouth every 6 (six) hours as needed for moderate pain.    Historical Provider, MD  loratadine (CLARITIN) 10 MG tablet Take 10 mg by mouth every evening.    Historical Provider, MD  LORazepam (ATIVAN) 0.5 MG tablet Take one tablet by mouth twice daily as needed for anxiety 01/24/14   Claudie Revering, NP  Omega-3 Fatty Acids (OMEGA 3 PO) Take 2 capsules by mouth 2 (two) times daily.    Historical Provider, MD  omeprazole (PRILOSEC OTC) 20 MG tablet Take 20 mg by mouth every other day.    Historical Provider, MD  oxymetazoline (AFRIN) 0.05 % nasal spray Place 1 spray into  both nostrils 2 (two) times daily as needed for congestion.    Historical Provider, MD  Polyethyl Glycol-Propyl Glycol (SYSTANE) 0.4-0.3 % SOLN Place 1 drop into both eyes 2 (two) times daily.    Historical Provider, MD  Probiotic Product (PROBIOTIC COLON SUPPORT) CAPS Take 1 capsule by mouth daily.    Historical Provider, MD  psyllium (METAMUCIL) 58.6 % packet Take 1 packet by mouth daily.    Historical Provider, MD  sodium chloride (OCEAN) 0.65 % SOLN nasal spray Place 1 spray into both nostrils as needed for congestion.    Historical Provider, MD   Physical Exam: Filed Vitals:   02/06/14 1728  BP: 119/69  Pulse: 83  Temp: 97.6 F (36.4 C)  TempSrc: Oral  Resp: 16  SpO2: 97%     General:  No apparent distress  Eyes: PERRL, EOMI, no scleral icterus  ENT: moist oropharynx  Neck: supple, no JVD  Cardiovascular: regular rate without MRG; 2+ peripheral pulses  Respiratory: CTA biL, good air movement without wheezing, rhonchi or crackled  Abdomen: soft, non tender to palpation, positive bowel sounds, no guarding, no rebound  Skin: no rashes  Musculoskeletal: 1-2+ peripheral edema  Psychiatric: normal mood and affect  Neurologic: CN 2-12 grossly intact, weakness bilateral LE > UE  Labs on Admission:  Basic Metabolic Panel:  Recent Labs Lab 02/04/14 1809 02/06/14 0353  NA 134* 138  K 4.4 4.3  CL 98 101  CO2 22 22  GLUCOSE 95 75  BUN 58* 55*  CREATININE 1.42* 1.40*  CALCIUM 8.7 8.9   CBC:  Recent Labs Lab 02/04/14 1809  WBC 9.0  HGB 12.7*  HCT 36.4*  MCV 91.5  PLT 104*   BNP (last 3 results)  Recent Labs  01/11/14 1444 01/15/14 0700 02/04/14 1809  PROBNP 9946.0* 2541.0* 5728.0*   Radiological Exams on Admission: Mr Lodema Pilot Contrast  02/05/2014   CLINICAL DATA:  Difficulty swallowing.  Possible ALS.  EXAM: MRI HEAD WITHOUT AND WITH CONTRAST  TECHNIQUE: Multiplanar, multiecho pulse sequences of the brain and surrounding structures were obtained  without and with intravenous contrast.  CONTRAST:  18mL MULTIHANCE GADOBENATE DIMEGLUMINE 529 MG/ML IV SOLN  COMPARISON:  None.  FINDINGS: No acute infarct.  No intracranial hemorrhage.  No intracranial MR findings of ALS.  Mild to moderate small vessel disease type changes.  Global atrophy without hydrocephalus.  No intracranial mass or abnormal enhancement.  Major intracranial vascular structures are patent.  Partial opacification mastoid air cells bilaterally without obstructing lesion noted in the region of the posterior superior nasopharynx causing eustachian tube dysfunction.  Mild transverse  ligament hypertrophy. Minimal spur C3-4 with very mild spinal stenosis.  IMPRESSION: No acute infarct.  No intracranial MR findings of ALS.  Mild to moderate small vessel disease type changes.  Global atrophy without hydrocephalus.  No intracranial mass or abnormal enhancement.  Partial opacification mastoid air cells bilaterally   Electronically Signed   By: Bridgett Larsson M.D.   On: 02/05/2014 14:46   Assessment/Plan Active Problems:   DYSLIPIDEMIA   Chronic systolic heart failure   Bilateral leg weakness   Bilateral arm weakness   Protein-calorie malnutrition, severe   Neuropathy   Severe primary axonal peripheral neuropathy - case discussed with Neurology again tonight, appreciate input.   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 Will pursue a muscle biopsy for diagnostic and prognostic purposes. Discussed with Dr. Daphine Deutscher and Surgery will see patient tomorrow.   Severe protein-calorie malnutrition - have asked IR for an NJ tube for enteral feeding at least for few days until we have biopsy results. Based on prognosis to decide on a more permanent feeding method.   Systolic heart failure - lungs relatively clear, no JVD but has 2+ pitting LE edema. He was on Lasix up until yesterday morning when it seems like he had increase in Creatinine and it was held. Clinically he looks somewhat  intravascularly depleted and is strict NPO. Will give gentle hydration overnight with D5 1/2 NS at 50 cc/h, closely monitor respiratory status. Strict I&O. Daily weights.   Dysphagia - due to #1, I placed an SLP consult.   Diet: NPO Fluids: D5 1/2 NS at 50 cc/h DVT Prophylaxis: SCDs  Code Status: DNR  Family Communication: wife, daughter at bedside  Disposition Plan: inpatient  Time spent: 9  This note has been created with Education officer, environmental. Any transcriptional errors are unintentional.   Justene Jensen M. Elvera Lennox, MD [facility] Pager [phone]  If 7PM-7AM, please contact night-coverage [email or web address] Password [facility] 02/06/2014, 5:23 PM

## 2014-02-07 ENCOUNTER — Inpatient Hospital Stay (HOSPITAL_COMMUNITY): Payer: Medicare Other

## 2014-02-07 DIAGNOSIS — D696 Thrombocytopenia, unspecified: Secondary | ICD-10-CM

## 2014-02-07 DIAGNOSIS — R5381 Other malaise: Secondary | ICD-10-CM

## 2014-02-07 DIAGNOSIS — I5022 Chronic systolic (congestive) heart failure: Secondary | ICD-10-CM

## 2014-02-07 DIAGNOSIS — E43 Unspecified severe protein-calorie malnutrition: Secondary | ICD-10-CM

## 2014-02-07 DIAGNOSIS — G609 Hereditary and idiopathic neuropathy, unspecified: Secondary | ICD-10-CM

## 2014-02-07 LAB — CBC
HCT: 36 % — ABNORMAL LOW (ref 39.0–52.0)
HEMOGLOBIN: 12.2 g/dL — AB (ref 13.0–17.0)
MCH: 31.5 pg (ref 26.0–34.0)
MCHC: 33.9 g/dL (ref 30.0–36.0)
MCV: 93 fL (ref 78.0–100.0)
Platelets: 119 10*3/uL — ABNORMAL LOW (ref 150–400)
RBC: 3.87 MIL/uL — AB (ref 4.22–5.81)
RDW: 13.8 % (ref 11.5–15.5)
WBC: 8.3 10*3/uL (ref 4.0–10.5)

## 2014-02-07 LAB — COMPREHENSIVE METABOLIC PANEL
ALT: 11 U/L (ref 0–53)
AST: 14 U/L (ref 0–37)
Albumin: 2.4 g/dL — ABNORMAL LOW (ref 3.5–5.2)
Alkaline Phosphatase: 54 U/L (ref 39–117)
BUN: 51 mg/dL — ABNORMAL HIGH (ref 6–23)
CO2: 22 meq/L (ref 19–32)
Calcium: 9 mg/dL (ref 8.4–10.5)
Chloride: 104 mEq/L (ref 96–112)
Creatinine, Ser: 1.18 mg/dL (ref 0.50–1.35)
GFR calc non Af Amer: 54 mL/min — ABNORMAL LOW (ref >90)
GFR, EST AFRICAN AMERICAN: 63 mL/min — AB (ref >90)
GLUCOSE: 94 mg/dL (ref 70–99)
Potassium: 4.5 mEq/L (ref 3.7–5.3)
SODIUM: 141 meq/L (ref 137–147)
TOTAL PROTEIN: 6 g/dL (ref 6.0–8.3)
Total Bilirubin: 0.5 mg/dL (ref 0.3–1.2)

## 2014-02-07 LAB — ANGIOTENSIN CONVERTING ENZYME: Angiotensin-Converting Enzyme: 3 U/L — ABNORMAL LOW (ref 8–52)

## 2014-02-07 LAB — SEDIMENTATION RATE: Sed Rate: 4 mm/hr (ref 0–16)

## 2014-02-07 LAB — C-REACTIVE PROTEIN: CRP: 3.9 mg/dL — ABNORMAL HIGH (ref <0.60)

## 2014-02-07 MED ORDER — JEVITY 1.2 CAL PO LIQD
1000.0000 mL | ORAL | Status: DC
Start: 2014-02-07 — End: 2014-02-07

## 2014-02-07 MED ORDER — VITAL 1.5 CAL PO LIQD
1000.0000 mL | ORAL | Status: DC
  Filled 2014-02-07: qty 1000

## 2014-02-07 MED ORDER — LORAZEPAM 2 MG/ML IJ SOLN
0.5000 mg | Freq: Once | INTRAMUSCULAR | Status: AC
  Administered 2014-02-07: 0.5 mg via INTRAVENOUS
  Filled 2014-02-07: qty 1

## 2014-02-07 MED ORDER — CEFAZOLIN SODIUM-DEXTROSE 2-3 GM-% IV SOLR
2.0000 g | INTRAVENOUS | Status: AC
Start: 2014-02-08 — End: 2014-02-08
  Administered 2014-02-08: 2 g via INTRAVENOUS
  Filled 2014-02-07: qty 50

## 2014-02-07 NOTE — Evaluation (Signed)
Clinical/Bedside Swallow Evaluation Patient Details  Name: Ethan Mclean MRN: [id number] Date of Birth: Oct 24, 1925  Today's Date: 02/07/2014 Time: 1137-1205 SLP Time Calculation (min): 28 min  Past Medical History:  Past Medical History  Diagnosis Date  . CHF (congestive heart failure)   . DVT (deep venous thrombosis)     "legs"  . Complication of anesthesia     "slow to wake up after eye OR"  . Type II diabetes mellitus     "dx'd years ago; don't have it anymroe" (01/11/2014)  . GERD (gastroesophageal reflux disease)   . Gout   . H/O echocardiogram     nonischemi cardiomyopathy EF 40-45%   . Asymptomatic PVCs   . Hypertension   . Statin intolerance   . Balance problems   . Chronic renal insufficiency   . Carpal tunnel syndrome on right   . Gout   . Osteoarthritis   . H/O CHF W/ LE Edema  . Hx of adenomatous colonic polyps   . Chronic edema     LE from chronic  DVTS   Past Surgical History:  Past Surgical History  Procedure Laterality Date  . Appendectomy    . Vena cava filter placement    . Cataract extraction w/ intraocular lens implant Left   . Tonsillectomy    . Eye surgery Bilateral     "to retain tears"  . Cardiac catheterization  12/2001    Normal coronary arteries by cath 20042   HPI:  80 year old male admitted to [facility] with h/o neuromuscular disorder affecting the lower extremity  Other PMH is significant for CHF, DM, GERD (not on PPI), Gout, dysphagia. Positives include dysphagia, occasional cough when he gets choked up on secretions that is associated with some mild shortness of breath. He has lower extremity chronic edema from previous DVT filter placement per md note. feet. Pt underwent MBS on May 13th with findings of moderate oral and severe pharyngeal dysphagia with absent swallow reflex with aspiration of barium as filled pharynx with absent swallow.  NPO was recommended with consideration for neuro visit and ? alternative means of nutriton.  BSE ordered  upon hospital admit.  MRI negative for acute infarct.   Assessment / Plan / Recommendation Clinical Impression  Pt continues with symptoms of gross pharyngeal dysphagia with suspected sensorimotor deficits.   Pt with very poor laryngeal elevation and suspected severe pharyngeal stasis without awareness/sensation.  Subtle throat clear and delayed cough noted after few small ice chips consumed.  SLP did not test further po based on MBS findings from 01/30/2014 and clinical results.  Spouse present reports pt has recently been treated for thrush and has been unable to eat and drink for nearly two weeks except taking medicine with applesauce crushed.  Some coughing noted when pt consuming medicine with applesauce prior to admission per family.    Recommend strict npo except small single ice chips to faciliate oral care/hygeine and to decrease further disuse muscle atrophy.   Concern for prognosis for swallow ability to return to functional level present d/t severity of deficits and length of time.    Extensive education completed with pt/family who expressed gratitude.   Will follow up.     Aspiration Risk  Severe    Diet Recommendation NPO;Ice chips PRN after oral care;Alternative means - temporary;Alternative means - long-term   Medication Administration: Via alternative means    Other  Recommendations Oral Care Recommendations: Oral care BID   Follow Up Recommendations  Frequency and Duration min 2x/week  2 weeks   Pertinent Vitals/Pain Afebrile, decreased     Swallow Study Prior Functional Status   see hhx    General Date of Onset: 01/18/14 HPI: 80 [facility] old [facility] with h/o neuromuscular disorder affecting the lower extremity  Other PMH is significant for CHF, DM, GERD (not on PPI), Gout, dysphagia. Positives include dysphagia, occasional cough when he gets choked up on secretions that is associated with some mild shortness of breath. He has lower extremity chronic  edema from previous DVT filter placement per md note. feet. Pt underwent MBS on May 13th with findings of moderate oral and severe pharyngeal dysphagia with absent swallow reflex with aspiration of barium as filled pharynx with absent swallow.  NPO was recommended with consideration for neuro visit and ? alternative means of nutriton.  BSE ordered upon hospital admit.  MRI negative for acute infarct. Previous Swallow Assessment: n/a Diet Prior to this Study: NPO Temperature Spikes Noted: No Respiratory Status: Room air History of Recent Intubation: No Behavior/Cognition: Alert;Cooperative;Pleasant mood;Confused;Requires cueing Oral Cavity - Dentition: Adequate natural dentition Self-Feeding Abilities: Needs assist Baseline Vocal Quality: Clear Volitional Cough: Weak Volitional Swallow: Unable to elicit    Oral/Motor/Sensory Function Overall Oral Motor/Sensory Function: Impaired (pt appears with facial, glossopharyngeal, vagus, spinal accessory and hypoglossal nerve impairment, speech appears hypernasal and with low attenuation) Lingual Strength:  (able to seal lips and retain air in cheeks) Velum:  (velum elevation appeared adequate)   Ice Chips Ice chips: Impaired (3 small single ice boluses given ) Presentation: Spoon Pharyngeal Phase Impairments: Decreased hyoid-laryngeal movement;Throat Clearing - Immediate;Cough - Delayed Other Comments: nearly absent laryngeal elevation/hyoid movement - pt attempts to trigger swallow   Thin Liquid Thin Liquid: Not tested    Nectar Thick Nectar Thick Liquid: Not tested   Honey Thick Honey Thick Liquid: Not tested   Puree Puree: Not tested   Solid   GO    Solid: Not tested       Mills Kolleramara Ann Carlota Philley Gerianne Simonet, MS [facility] SLP [phone]

## 2014-02-07 NOTE — Progress Notes (Signed)
2 RNs, Meredith Kilbride and [employer], were unsuccessful placing the post pyloric NJ tube for feeding.

## 2014-02-07 NOTE — Consult Note (Signed)
ATTENDING ADDENDUM:  I personally reviewed patient's record, examined the patient, and formulated the following assessment and plan:  Can do muscle bx in AM.  Will coordinate with pathology.  NPO p MN    

## 2014-02-07 NOTE — Progress Notes (Signed)
PARENTERAL NUTRITION CONSULT NOTE - INITIAL  Pharmacy Consult for TNA Indication: Severe protein-calorie malnutrition  Allergies  Allergen Reactions  . Codeine Phosphate     unknown  . Iohexol      Desc: contrast induced renal insufficiency in past, entered 12/25/2004 bsw     . Sulfonamide Derivatives     unknown    Patient Measurements: Height: 6\' 3"  (190.5 cm) Weight: 196 lb 3.4 oz (89 kg) IBW/kg (Calculated) : 84.5 Usual Weight: 197 lb  Vital Signs: Temp: 97.6 F (36.4 C) (05/21 1414) Temp src: Oral (05/21 1414) BP: 126/83 mmHg (05/21 1414) Pulse Rate: 89 (05/21 1414) Intake/Output from previous day: 05/20 0701 - 05/21 0700 In: 595.8 [I.V.:595.8] Out: -  Intake/Output from this shift: Total I/O In: -  Out: 75 [Urine:75]  Labs:  Recent Labs  02/04/14 1809 02/07/14 0502  WBC 9.0 8.3  HGB 12.7* 12.2*  HCT 36.4* 36.0*  PLT 104* 119*     Recent Labs  02/04/14 1809 02/06/14 0353 02/07/14 0502  NA 134* 138 141  K 4.4 4.3 4.5  CL 98 101 104  CO2 22 22 22   GLUCOSE 95 75 94  BUN 58* 55* 51*  CREATININE 1.42* 1.40* 1.18  CALCIUM 8.7 8.9 9.0  PROT  --   --  6.0  ALBUMIN  --   --  2.4*  AST  --   --  14  ALT  --   --  11  ALKPHOS  --   --  54  BILITOT  --   --  0.5   Estimated Creatinine Clearance: 54.7 ml/min (by C-G formula based on Cr of 1.18).   No results found for this basename: GLUCAP,  in the last 72 hours  Medical History: Past Medical History  Diagnosis Date  . CHF (congestive heart failure)   . DVT (deep venous thrombosis)     "legs"  . Complication of anesthesia     "slow to wake up after eye OR"  . Type II diabetes mellitus     "dx'd years ago; don't have it anymroe" (01/11/2014)  . GERD (gastroesophageal reflux disease)   . Gout   . H/O echocardiogram     nonischemi cardiomyopathy EF 40-45%   . Asymptomatic PVCs   . Hypertension   . Statin intolerance   . Balance problems   . Chronic renal insufficiency   . Carpal tunnel  syndrome on right   . Gout   . Osteoarthritis   . H/O CHF W/ LE Edema  . Hx of adenomatous colonic polyps   . Chronic edema     LE from chronic  DVTS    Medications:  Scheduled:  . [START ON 02/08/2014]  ceFAZolin (ANCEF) IV  2 g Intravenous On Call to OR   Infusions:  . feeding supplement (VITAL 1.5 CAL)     PRN: albuterol  Insulin Requirements in the past 24 hours:  No insulin ordered, CBGs within goal range  Current Nutrition:  TF: Vital 1.5 cal ordered - *Unable to place enteral feeding access*   Assessment: 80 yo M with progressive neuromuscular disorder over several years with severe weakness of lower extremities as well as severe weakness distally of upper extremities and moderate weakness proximally of upper extremities. 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 Patient was  discharged from the hospital service approximately 3 weeks ago after a hospitalization for diarrhea, went to a SNF where he was found to have inability to initiate swallowing function based on a MBS study.  Patient has not consumed any solid foods in the past month, and limited liquids.  Attempted to start enteral feedings today but unable to place post pyloric NJ tube for feeding.  TNA ordered per pharmacy.   Labs Electrolytes: WNL, f/u TNA labs in AM Renal: Hx CKD, Scr currently WNL 1.18, CrCl 55 ml/min  Liver: WNL TG: F/u Pre-albumin: F/u Glucose: No Hx DM.  CBGs currently at goal < 150  Nutritional Goals:  Kcal: 2300-2500  Protein: 115-130g  Fluid: 2.3-2.5L/daily  Plan:  1.) Plan is for IV team to place PICC first thing on 5/22, TNA to start 5/22 2.) TNA labs for tomorrow morning   Loma Messing Lorriane Dehart PharmD Pager #: [phone] 6:04 PM 02/07/2014

## 2014-02-07 NOTE — Progress Notes (Signed)
Subjective: Patient has no complaints.  Objective: Current vital signs: BP 121/78  Pulse 75  Temp(Src) 98.5 F (36.9 C) (Oral)  Resp 18  Ht 6\' 3"  (1.905 m)  Wt 89 kg (196 lb 3.4 oz)  BMI 24.52 kg/m2  SpO2 96% Vital signs in last 24 hours: Temp:  [97.6 F (36.4 C)-98.5 F (36.9 C)] 98.5 F (36.9 C) (05/21 0507) Pulse Rate:  [45-83] 75 (05/21 0507) Resp:  [16-18] 18 (05/21 0507) BP: (119-123)/(69-86) 121/78 mmHg (05/21 0507) SpO2:  [95 %-97 %] 96 % (05/21 0507) Weight:  [89 kg (196 lb 3.4 oz)] 89 kg (196 lb 3.4 oz) (05/20 1728)  Intake/Output from previous day: 05/20 0701 - 05/21 0700 In: 595.8 [I.V.:595.8] Out: -  Intake/Output this shift: Total I/O In: -  Out: 75 [Urine:75] Nutritional status: NPO  Neurologic Exam: Mental Status:  Alert, oriented, thought content appropriate. Speech fluent without evidence of aphasia. Able to follow 3 step commands without difficulty.  Cranial Nerves:  II: Discs flat bilaterally; Visual fields grossly normal, pupils equal, round, reactive to light and accommodation  III,IV, VI: ptosis not present, extra-ocular motions intact bilaterally  V,VII: continues to have weakness of upper face and orbicularis oculi along with lower face.  VIII: hearing normal bilaterally  IX,X: gag reflex present  XI: bilateral shoulder shrug  XII: midline tongue extension without atrophy or fasciculations  Motor:  Weak neck flexors and weakness of bilateral UE with distal>proximal. Mild to moderate atrophy of upper extremities proximally as well as moderately severe atrophy distally. Patient had severe weakness of lower extremities as well, proximally and distally. Again, no fasciculations were noted. Muscle tone was flaccid throughout.  Sensory: Pinprick and light touch intact throughout, bilaterally  Deep Tendon Reflexes:  0/4 in UE and LE  Plantars:  Mute bilaterally      Lab Results: Basic Metabolic Panel:  Recent Labs Lab 02/04/14 1809  02/06/14 0353 02/07/14 0502  NA 134* 138 141  K 4.4 4.3 4.5  CL 98 101 104  CO2 22 22 22   GLUCOSE 95 75 94  BUN 58* 55* 51*  CREATININE 1.42* 1.40* 1.18  CALCIUM 8.7 8.9 9.0    Liver Function Tests:  Recent Labs Lab 02/07/14 0502  AST 14  ALT 11  ALKPHOS 54  BILITOT 0.5  PROT 6.0  ALBUMIN 2.4*   No results found for this basename: LIPASE, AMYLASE,  in the last 168 hours No results found for this basename: AMMONIA,  in the last 168 hours  CBC:  Recent Labs Lab 02/04/14 1809 02/07/14 0502  WBC 9.0 8.3  HGB 12.7* 12.2*  HCT 36.4* 36.0*  MCV 91.5 93.0  PLT 104* 119*    Cardiac Enzymes:  Recent Labs Lab 02/06/14 1807  CKTOTAL 57    Lipid Panel: No results found for this basename: CHOL, TRIG, HDL, CHOLHDL, VLDL, LDLCALC,  in the last 168 hours  CBG: No results found for this basename: GLUCAP,  in the last 168 hours  Microbiology: Results for orders placed during the hospital encounter of 01/11/14  CLOSTRIDIUM DIFFICILE BY PCR     Status: None   Collection Time    01/11/14  6:36 PM      Result Value Ref Range Status   C difficile by pcr NEGATIVE  NEGATIVE Final  STOOL CULTURE     Status: None   Collection Time    01/11/14  6:36 PM      Result Value Ref Range Status   Specimen Description  STOOL   Final   Special Requests NONE   Final   Culture     Final   Value: NO SALMONELLA, SHIGELLA, CAMPYLOBACTER, YERSINIA, OR E.COLI 0157:H7 ISOLATED     Performed at [facility]   Report Status 01/15/2014 FINAL   Final  OVA AND PARASITE EXAMINATION     Status: None   Collection Time    01/11/14  6:36 PM      Result Value Ref Range Status   Specimen Description STOOL   Final   Special Requests NONE   Final   Ova and parasites     Final   Value: NO OVA OR PARASITES SEEN MODERATE YEAST     Performed at [facility]   Report Status 01/15/2014 FINAL   Final    Coagulation Studies: No results found for this basename: LABPROT, INR,  in the  last 72 hours  Imaging: Mr Laqueta JeanBrain W Wo Contrast  02/05/2014   CLINICAL DATA:  Difficulty swallowing.  Possible ALS.  EXAM: MRI HEAD WITHOUT AND WITH CONTRAST  TECHNIQUE: Multiplanar, multiecho pulse sequences of the brain and surrounding structures were obtained without and with intravenous contrast.  CONTRAST:  18mL MULTIHANCE GADOBENATE DIMEGLUMINE 529 MG/ML IV SOLN  COMPARISON:  None.  FINDINGS: No acute infarct.  No intracranial hemorrhage.  No intracranial MR findings of ALS.  Mild to moderate small vessel disease type changes.  Global atrophy without hydrocephalus.  No intracranial mass or abnormal enhancement.  Major intracranial vascular structures are patent.  Partial opacification mastoid air cells bilaterally without obstructing lesion noted in the region of the posterior superior nasopharynx causing eustachian tube dysfunction.  Mild transverse ligament hypertrophy. Minimal spur C3-4 with very mild spinal stenosis.  IMPRESSION: No acute infarct.  No intracranial MR findings of ALS.  Mild to moderate small vessel disease type changes.  Global atrophy without hydrocephalus.  No intracranial mass or abnormal enhancement.  Partial opacification mastoid air cells bilaterally   Electronically Signed   By: Bridgett LarssonSteve  Olson M.D.   On: 02/05/2014 14:46    Medications: Scheduled:   Assessment/Plan: Progressive neuromuscular disorder over several years with severe weakness of lower extremities as well as severe weakness distally of upper extremities and moderate weakness proximally of upper extremities. 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 A muscle biopsy will be undertaken, further tests for autoimmune disease will be obtained and [facility]. While in hospital he will  obtain a nutritional consult and TRH are planning to place a NJ tube.      Felicie MornDavid Smith PA-C [facility] [phone]  02/07/2014, 10:06 AM Patient seen and examined together with physician assistant and I concur with the assessment and plan.  Wyatt Portelasvaldo Maridel Pixler, MD

## 2014-02-07 NOTE — Progress Notes (Signed)
PROGRESS NOTE  Ethan Mclean Ethan Mclean DOB: 09/20/1926 DOA: 02/06/2014 PCP: Ethan BoysFRIED, ROBERT L, MD  HPI: Ethan Mclean is a 80 y.o. male has a past medical history significant for systolic heart failure (EF 25-30% on 4/26), HTN, DM, admitted after had a nerve conduction study in the [facility] office. He was discharged from the hospital service approximately 3 weeks ago after a hospitalization for diarrhea, went to a SNF where he was found to have inability to initiate swallowing function based on a MBS study. He was supposed to see neurology as an outpatient however was unable to get there. As previously described in Dr. Chancy Mclean's H&P, Ethan Mclean had progressively worsening loss of motor function of his lower extremities to the point now where he could barely move his feet. This has not affected his sensation. He also noticed progressively worsening decline in upper extremity function, but still has some strength and loss of fine motor control and decreasing gross motor control.  Assessment/Plan: Severe primary axonal peripheral neuropathy  - neurology following, autoimmune workup in process - [facility], for close neurology follow up over the weekend.  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  Severe protein-calorie malnutrition  - place a post pyloric tube, start feeds per nutrition, appreciate assistance.  Systolic heart failure - gentle hydration overnight, renal function better this morning - restart Lasix once tube feeds initiated - stop IVF for now Dysphagia - due to #1.  Thrombocytopenia - likely in the setting of severe malnutrition  Diet: NPO Fluids: none DVT Prophylaxis: SCD  Code Status: DNR Family Communication: wife at bedside  Disposition Plan: inpatient  Consultants:  Neurology   Surgery   Procedures:  None    Antibiotics - none  HPI/Subjective: - feels weak  Objective: Filed  Vitals:   02/06/14 1728 02/06/14 2111 02/07/14 0507  BP: 119/69 123/86 121/78  Pulse: 83 45 75  Temp: 97.6 F (36.4 C) 97.6 F (36.4 C) 98.5 F (36.9 C)  TempSrc: Oral Oral Oral  Resp: 16 18 18   Height: 6\' 3"  (1.905 m)    Weight: 89 kg (196 lb 3.4 oz)    SpO2: 97% 95% 96%    Intake/Output Summary (Last 24 hours) at 02/07/14 [id number] Last data filed at 02/07/14 0701  Gross per 24 hour  Intake 595.83 ml  Output     75 ml  Net 520.83 ml   Filed Weights   02/06/14 1728  Weight: 89 kg (196 lb 3.4 oz)   Exam:  General:  NAD  Cardiovascular: regular rate and rhythm, without MRG  Respiratory: good air movement, clear to auscultation throughout, no wheezing, ronchi or rales  Abdomen: soft, not tender to palpation, positive bowel sounds  MSK: 2+ pitting LE edema  Neuro: weakness LE > UE  Data Reviewed: Basic Metabolic Panel:  Recent Labs Lab 02/04/14 1809 02/06/14 0353 02/07/14 0502  NA 134* 138 141  K 4.4 4.3 4.5  CL 98 101 104  CO2 22 22 22   GLUCOSE 95 75 94  BUN 58* 55* 51*  CREATININE 1.42* 1.40* 1.18  CALCIUM 8.7 8.9 9.0   Liver Function Tests:  Recent Labs Lab 02/07/14 0502  AST 14  ALT 11  ALKPHOS 54  BILITOT 0.5  PROT 6.0  ALBUMIN 2.4*   CBC:  Recent Labs Lab 02/04/14 1809 02/07/14 0502  WBC 9.0 8.3  HGB 12.7* 12.2*  HCT 36.4* 36.0*  MCV 91.5  93.0  PLT 104* 119*   Cardiac Enzymes:  Recent Labs Lab 02/06/14 1807  CKTOTAL 57   BNP (last 3 results)  Recent Labs  01/11/14 1444 01/15/14 0700 02/04/14 1809  PROBNP 9946.0* 2541.0* 5728.0*   Studies: Mr Lodema Pilot Contrast  03/05/2014   CLINICAL DATA:  Difficulty swallowing.  Possible ALS.  EXAM: MRI HEAD WITHOUT AND WITH CONTRAST  TECHNIQUE: Multiplanar, multiecho pulse sequences of the brain and surrounding structures were obtained without and with intravenous contrast.  CONTRAST:  61mL MULTIHANCE GADOBENATE DIMEGLUMINE 529 MG/ML IV SOLN  COMPARISON:  None.  FINDINGS: No acute  infarct.  No intracranial hemorrhage.  No intracranial MR findings of ALS.  Mild to moderate small vessel disease type changes.  Global atrophy without hydrocephalus.  No intracranial mass or abnormal enhancement.  Major intracranial vascular structures are patent.  Partial opacification mastoid air cells bilaterally without obstructing lesion noted in the region of the posterior superior nasopharynx causing eustachian tube dysfunction.  Mild transverse ligament hypertrophy. Minimal spur C3-4 with very mild spinal stenosis.  IMPRESSION: No acute infarct.  No intracranial MR findings of ALS.  Mild to moderate small vessel disease type changes.  Global atrophy without hydrocephalus.  No intracranial mass or abnormal enhancement.  Partial opacification mastoid air cells bilaterally   Electronically Signed   By: Bridgett Larsson M.D.   On: 05-Mar-2014 14:46   Scheduled Meds:  Continuous Infusions: . dextrose 5 % and 0.45% NaCl 50 mL/hr at 02/06/14 1836   Active Problems:   DYSLIPIDEMIA   Chronic systolic heart failure   Bilateral leg weakness   Bilateral arm weakness   Protein-calorie malnutrition, severe   Neuropathy  Time spent: 25  This note has been created with Education officer, environmental. Any transcriptional errors are unintentional.   Pamella Pert, MD [facility] Pager [phone]. If 7 PM - 7 AM, please contact night-coverage at [email or web address], password [facility] 02/07/2014, 9:23 AM  LOS: 1 day

## 2014-02-07 NOTE — Consult Note (Signed)
Reason for Consult:  Muscle biopsy for evaluation of inflammatory myositis Referring Physician:  Dr. Terance Ice  Ethan Mclean is an 80 y.o. male.  HPI: Unfortunate gentleman with progressive weakness both lower legs, and now unable to swallow.  Nerve conduction studies on upper and lower extremities show a severe axonal peripheral neuropathy, consistent with an inflammatory myositis.  They recommended deltoid or bicep muscle biopsy. We are ask to see. He has multiple medical issues as listed below.  Past Medical History  Diagnosis Date  . CHF (congestive heart failure)   . DVT (deep venous thrombosis)     "legs"  . Complication of anesthesia     "slow to wake up after eye OR"  . Type II diabetes mellitus     "dx'd years ago; don't have it anymroe" (01/11/2014)  . GERD (gastroesophageal reflux disease)   . Gout   . H/O echocardiogram     nonischemi cardiomyopathy EF 40-45%   . Asymptomatic PVCs   . Hypertension   . Statin intolerance   . Balance problems   . Chronic renal insufficiency   . Carpal tunnel syndrome on right   . Gout   . Osteoarthritis   . H/O CHF W/ LE Edema  . Hx of adenomatous colonic polyps   . Chronic edema     LE from chronic  DVTS    Past Surgical History  Procedure Laterality Date  . Appendectomy    . Vena cava filter placement    . Cataract extraction w/ intraocular lens implant Left   . Tonsillectomy    . Eye surgery Bilateral     "to retain tears"  . Cardiac catheterization  12/2001    Normal coronary arteries by cath 2003    Family History  Problem Relation Age of Onset  . Cancer Other     Social History:  reports that he quit smoking about 62 years ago. His smoking use included Cigarettes. He smoked 0.00 packs per day for 5 years. He has never used smokeless tobacco. He reports that he drinks alcohol. He reports that he does not use illicit drugs.  Allergies:  Allergies  Allergen Reactions  . Codeine Phosphate     unknown  .  Iohexol      Desc: contrast induced renal insufficiency in past, entered 12/25/2004 bsw     . Sulfonamide Derivatives     unknown    Medications:  Prior to Admission:  Prescriptions prior to admission  Medication Sig Dispense Refill  . acetaminophen (TYLENOL) 500 MG tablet Take 500 mg by mouth every other day as needed for mild pain.      Marland Kitchen aspirin EC 325 MG tablet Take 325 mg by mouth every other day.      . calcium carbonate (TUMS - DOSED IN MG ELEMENTAL CALCIUM) 500 MG chewable tablet Chew 250 mg by mouth every other day.      Marland Kitchen HYDROcodone-acetaminophen (NORCO/VICODIN) 5-325 MG per tablet Take 1 tablet by mouth every 6 (six) hours as needed for moderate pain.      Marland Kitchen loratadine (CLARITIN) 10 MG tablet Take 10 mg by mouth every evening.      Marland Kitchen LORazepam (ATIVAN) 0.5 MG tablet Take one tablet by mouth twice daily as needed for anxiety  60 tablet  5  . Omega-3 Fatty Acids (OMEGA 3 PO) Take 2 capsules by mouth 2 (two) times daily.      Marland Kitchen omeprazole (PRILOSEC OTC) 20 MG tablet Take 20 mg by  mouth every other day.      Marland Kitchen oxymetazoline (AFRIN) 0.05 % nasal spray Place 1 spray into both nostrils 2 (two) times daily as needed for congestion.      Vladimir Faster Glycol-Propyl Glycol (SYSTANE) 0.4-0.3 % SOLN Place 1 drop into both eyes 2 (two) times daily.      . Probiotic Product (PROBIOTIC COLON SUPPORT) CAPS Take 1 capsule by mouth daily.      . psyllium (METAMUCIL) 58.6 % packet Take 1 packet by mouth daily.      . sodium chloride (OCEAN) 0.65 % SOLN nasal spray Place 1 spray into both nostrils as needed for congestion.       Scheduled:  Continuous:  FIE:[id number] Anti-infectives   None      Results for orders placed during the hospital encounter of 02/06/14 (from the past 48 hour(s))  CK     Status: None   Collection Time    02/06/14  6:07 PM      Result Value Ref Range   Total CK 57  7 - 232 U/L  CBC     Status: Abnormal   Collection Time    02/07/14  5:02 AM      Result Value Ref  Range   WBC 8.3  4.0 - 10.5 K/uL   RBC 3.87 (*) 4.22 - 5.81 MIL/uL   Hemoglobin 12.2 (*) 13.0 - 17.0 g/dL   HCT 36.0 (*) 39.0 - 52.0 %   MCV 93.0  78.0 - 100.0 fL   MCH 31.5  26.0 - 34.0 pg   MCHC 33.9  30.0 - 36.0 g/dL   RDW 13.8  11.5 - 15.5 %   Platelets 119 (*) 150 - 400 K/uL   Comment: CONSISTENT WITH PREVIOUS RESULT  COMPREHENSIVE METABOLIC PANEL     Status: Abnormal   Collection Time    02/07/14  5:02 AM      Result Value Ref Range   Sodium 141  137 - 147 mEq/L   Potassium 4.5  3.7 - 5.3 mEq/L   Chloride 104  96 - 112 mEq/L   CO2 22  19 - 32 mEq/L   Glucose, Bld 94  70 - 99 mg/dL   BUN 51 (*) 6 - 23 mg/dL   Creatinine, Ser 1.18  0.50 - 1.35 mg/dL   Calcium 9.0  8.4 - 10.5 mg/dL   Total Protein 6.0  6.0 - 8.3 g/dL   Albumin 2.4 (*) 3.5 - 5.2 g/dL   AST 14  0 - 37 U/L   ALT 11  0 - 53 U/L   Alkaline Phosphatase 54  39 - 117 U/L   Total Bilirubin 0.5  0.3 - 1.2 mg/dL   GFR calc non Af Amer 54 (*) >90 mL/min   GFR calc Af Amer 63 (*) >90 mL/min   Comment: (NOTE)     The eGFR has been calculated using the CKD EPI equation.     This calculation has not been validated in all clinical situations.     eGFR's persistently <90 mL/min signify possible Chronic Kidney     Disease.    Ethan Mclean Wo Contrast  02/05/2014   CLINICAL DATA:  Difficulty swallowing.  Possible ALS.  EXAM: MRI HEAD WITHOUT AND WITH CONTRAST  TECHNIQUE: Multiplanar, multiecho pulse sequences of the brain and surrounding structures were obtained without and with intravenous contrast.  CONTRAST:  13m MULTIHANCE GADOBENATE DIMEGLUMINE 529 MG/ML IV SOLN  COMPARISON:  None.  FINDINGS: No acute infarct.  No intracranial hemorrhage.  No intracranial Ethan findings of ALS.  Mild to moderate small vessel disease type changes.  Global atrophy without hydrocephalus.  No intracranial mass or abnormal enhancement.  Major intracranial vascular structures are patent.  Partial opacification mastoid air cells bilaterally without  obstructing lesion noted in the region of the posterior superior nasopharynx causing eustachian tube dysfunction.  Mild transverse ligament hypertrophy. Minimal spur C3-4 with very mild spinal stenosis.  IMPRESSION: No acute infarct.  No intracranial Ethan findings of ALS.  Mild to moderate small vessel disease type changes.  Global atrophy without hydrocephalus.  No intracranial mass or abnormal enhancement.  Partial opacification mastoid air cells bilaterally   Electronically Signed   By: Chauncey Cruel M.D.   On: 02/05/2014 14:46    Review of Systems  Constitutional: Negative.   All other systems reviewed and are negative.  Blood pressure 121/78, pulse 75, temperature 98.5 F (36.9 C), temperature source Oral, resp. rate 18, height _0  (1.905 m), weight 89 kg (196 lb 3.4 oz), SpO2 96.00%. Physical Exam  Constitutional: He is oriented to person, place, and time.  Elderly bed ridden male, NAD  HENT:  Head: Normocephalic and atraumatic.  Eyes:  He is light sensitive so I could not really keep lights on to examine.  Neck: Normal range of motion. Neck supple. No tracheal deviation present. No thyromegaly present.  Cardiovascular: Normal rate, regular rhythm, normal heart sounds and intact distal pulses.  Exam reveals no gallop.   No murmur heard. Respiratory: Effort normal and breath sounds normal. No respiratory distress. He has no wheezes. He has no rales. He exhibits no tenderness.  GI: Soft. Bowel sounds are normal. He exhibits no distension and no mass. There is no tenderness. There is no rebound and no guarding.  Musculoskeletal: He exhibits no tenderness. Edema: 2-3 edema both lower legs.  Neurological: He is alert and oriented to person, place, and time.  Skin: Skin is warm and dry. No rash noted. No erythema.  Psychiatric: He has a normal mood and affect. His behavior is normal. Judgment and thought content normal.  Family with him, including granddaughter who makes decisions for them.     Assessment/Plan: 1.  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 2.   Severe Protein calorie malnutrition with significant dysphagia. 3.  Severe systolic heart failure with bilateral lower leg edema. 4.  Severe deconditioning   5.  Thrombocytopenia (119K)  Plan:  We will need to talk to pathology and see what they need for this.  Medicine needs to transport him to [facility] so neurology can follow over the long weekend.  We will get back to you as soon as we work out the logistics of getting biopsy and proper pathology evaluation.  Earnstine Regal 02/07/2014, 11:18 AM

## 2014-02-07 NOTE — Progress Notes (Signed)
INITIAL NUTRITION ASSESSMENT  Pt meets criteria for severe MALNUTRITION in the context of chronic illness as evidenced by <75% estimated energy intake in the past month with severe muscle wasting and subcutaneous fat loss in temporal and orbital region, clavicles, and upper arms.  DOCUMENTATION CODES Per approved criteria  -Severe malnutrition in the context of chronic illness   INTERVENTION: - Once NJ tube placed and ready for use, start TF of Vital 1.5 at 56m/hr. Will keep at this rate until refeeding labs back and WNL. Recommend goal rate of Vital 1.5 at 776mhr which will provide 2520 calories, 113g protein, and 128470mree water and meet 100% of estimated calorie needs and 98% estimated protein needs. If IVF d/c, recommend 200m62mter flushes 6 times/day.  - Will order adult enteral protocol - Per verbal approval from MD, will order potassium, magnesium, and phosphorus lab values  - RD to continue to monitor   NUTRITION DIAGNOSIS: Inadequate oral intake related to inability to eat as evidenced by NPO.   Goal: 1. TF to meet >90% of estimated nutritional needs 2. Refeeding labs WNL once TF initiated   Monitor:  Weights, labs, TF tolerance/advancement  Reason for Assessment: Low braden, TF management  87 y3. male  Admitting Dx: Weakness   ASSESSMENT: Pt admitted with progressive neuromuscular disorder over several years with severe weakness of lower extremities as well as severe weakness distally of upper extremities and moderate weakness proximally of upper extremities. 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 Hx of CHF, DVT, GERD, type 2 DM, HTN, chronic edema for 4 years per pt, and chronic renal insufficiency.   -Pt discussed during multidisciplinary rounds.  -Met with pt and wife -Both report pt has  not consumed any solid foods in the past month. Was consuming liquids but got to the point he couldn't even swallow water 2-3 weeks ago -Pt unsure of usual weight as he has had significant bilateral extremity edema for the past 4 years -States the most he has ever weighed has been 230 pounds -Per conversation with MD, plan is to place NJ tube and for RD to manage TF and can order refeeding labs  Nutrition Focused Physical Exam:  Subcutaneous Fat:  Orbital Region: severe muscle wasting and subcutaneous fat loss Upper Arm Region: severe muscle wasting and subcutaneous fat loss Thoracic and Lumbar Region: NA  Muscle:  Temple Region: severe muscle wasting and subcutaneous fat loss Clavicle Bone Region: severe muscle wasting and subcutaneous fat loss Clavicle and Acromion Bone Region: severe muscle wasting and subcutaneous fat loss Scapular Bone Region: NA Dorsal Hand: mild fat loss, severe muscle wasting Patellar Region: NA Anterior Thigh Region: NA Posterior Calf Region: NA  Edema: +4 RLE, LLE edema   Height: Ht Readings from Last 1 Encounters:  02/06/14 _0  (1.905 m)    Weight: Wt Readings from Last 1 Encounters:  02/06/14 196 lb 3.4 oz (89 kg)    Ideal Body Weight: 196 lbs   % Ideal Body Weight: 100%  Wt Readings from Last 10 Encounters:  02/06/14 196 lb 3.4 oz (89 kg)  02/04/14 197 lb (89.359 kg)  01/28/14 186 lb 9.6 oz (84.641 kg)  01/16/14 191 lb (86.637 kg)  01/13/14 203 lb 4.8 oz (92.216 kg)    Usual Body Weight: 197 lbs  % Usual Body Weight: 99%  BMI:  Body mass index  is 24.52 kg/(m^2). - not accurate r/t +4 RLE, LLE edema  Estimated Nutritional Needs: Kcal: 2300-2500 Protein: 115-130g Fluid: 2.3-2.5L/daily   Skin: +4 RLE, LLE edema, stage 2 sacral pressure ulcer   Diet Order: NPO  EDUCATION NEEDS: -No education needs identified at this time   Intake/Output Summary (Last 24 hours) at 02/07/14 1138 Last data filed at 02/07/14 0701  Gross per  24 hour  Intake 595.83 ml  Output     75 ml  Net 520.83 ml    Last BM: 5/19  Labs:   Recent Labs Lab 02/04/14 1809 02/06/14 0353 02/07/14 0502  NA 134* 138 141  K 4.4 4.3 4.5  CL 98 101 104  CO2 _0 BUN 58* 55* 51*  CREATININE 1.42* 1.40* 1.18  CALCIUM 8.7 8.9 9.0  GLUCOSE 95 75 94    CBG (last 3)  No results found for this basename: GLUCAP,  in the last 72 hours  Scheduled Meds:   Continuous Infusions:   Past Medical History  Diagnosis Date  . CHF (congestive heart failure)   . DVT (deep venous thrombosis)     "legs"  . Complication of anesthesia     "slow to wake up after eye OR"  . Type II diabetes mellitus     "dx'd years ago; don't have it anymroe" (01/11/2014)  . GERD (gastroesophageal reflux disease)   . Gout   . H/O echocardiogram     nonischemi cardiomyopathy EF 40-45%   . Asymptomatic PVCs   . Hypertension   . Statin intolerance   . Balance problems   . Chronic renal insufficiency   . Carpal tunnel syndrome on right   . Gout   . Osteoarthritis   . H/O CHF W/ LE Edema  . Hx of adenomatous colonic polyps   . Chronic edema     LE from chronic  DVTS    Past Surgical History  Procedure Laterality Date  . Appendectomy    . Vena cava filter placement    . Cataract extraction w/ intraocular lens implant Left   . Tonsillectomy    . Eye surgery Bilateral     "to retain tears"  . Cardiac catheterization  12/2001    Normal coronary arteries by cath [address] [place], [place], [place] Pager [phone] After Hours Pager

## 2014-02-08 ENCOUNTER — Encounter (HOSPITAL_COMMUNITY): Payer: Medicare Other | Admitting: Certified Registered Nurse Anesthetist

## 2014-02-08 ENCOUNTER — Encounter (HOSPITAL_COMMUNITY)
Admission: AD | Disposition: A | Discharge status: Other Acute Inpatient Hospital | Payer: Self-pay | Source: Ambulatory Visit | Attending: Internal Medicine

## 2014-02-08 ENCOUNTER — Inpatient Hospital Stay (HOSPITAL_COMMUNITY): Payer: Medicare Other | Admitting: Certified Registered Nurse Anesthetist

## 2014-02-08 ENCOUNTER — Inpatient Hospital Stay (HOSPITAL_COMMUNITY): Payer: Medicare Other

## 2014-02-08 ENCOUNTER — Encounter (HOSPITAL_COMMUNITY): Payer: Self-pay | Admitting: Certified Registered Nurse Anesthetist

## 2014-02-08 DIAGNOSIS — IMO0001 Reserved for inherently not codable concepts without codable children: Secondary | ICD-10-CM

## 2014-02-08 HISTORY — PX: MUSCLE BIOPSY: SHX716

## 2014-02-08 LAB — CBC
HCT: 36.8 % — ABNORMAL LOW (ref 39.0–52.0)
Hemoglobin: 12.6 g/dL — ABNORMAL LOW (ref 13.0–17.0)
MCH: 31.7 pg (ref 26.0–34.0)
MCHC: 34.2 g/dL (ref 30.0–36.0)
MCV: 92.5 fL (ref 78.0–100.0)
PLATELETS: 123 10*3/uL — AB (ref 150–400)
RBC: 3.98 MIL/uL — ABNORMAL LOW (ref 4.22–5.81)
RDW: 13.8 % (ref 11.5–15.5)
WBC: 8 10*3/uL (ref 4.0–10.5)

## 2014-02-08 LAB — ANA: ANA: NEGATIVE

## 2014-02-08 LAB — COMPREHENSIVE METABOLIC PANEL
ALBUMIN: 2.6 g/dL — AB (ref 3.5–5.2)
ALT: 10 U/L (ref 0–53)
AST: 17 U/L (ref 0–37)
Alkaline Phosphatase: 55 U/L (ref 39–117)
BUN: 44 mg/dL — ABNORMAL HIGH (ref 6–23)
CALCIUM: 9.2 mg/dL (ref 8.4–10.5)
CO2: 23 mEq/L (ref 19–32)
Chloride: 103 mEq/L (ref 96–112)
Creatinine, Ser: 1.08 mg/dL (ref 0.50–1.35)
GFR calc non Af Amer: 61 mL/min — ABNORMAL LOW (ref >90)
GFR, EST AFRICAN AMERICAN: 70 mL/min — AB (ref >90)
GLUCOSE: 79 mg/dL (ref 70–99)
POTASSIUM: 4.4 meq/L (ref 3.7–5.3)
Sodium: 138 mEq/L (ref 137–147)
TOTAL PROTEIN: 6 g/dL (ref 6.0–8.3)
Total Bilirubin: 0.6 mg/dL (ref 0.3–1.2)

## 2014-02-08 LAB — SURGICAL PCR SCREEN
MRSA, PCR: POSITIVE — AB
STAPHYLOCOCCUS AUREUS: POSITIVE — AB

## 2014-02-08 LAB — GLUCOSE, CAPILLARY
Glucose-Capillary: 78 mg/dL (ref 70–99)
Glucose-Capillary: 95 mg/dL (ref 70–99)

## 2014-02-08 LAB — DIFFERENTIAL
Basophils Absolute: 0 10*3/uL (ref 0.0–0.1)
Basophils Relative: 0 % (ref 0–1)
Eosinophils Absolute: 0.1 10*3/uL (ref 0.0–0.7)
Eosinophils Relative: 2 % (ref 0–5)
LYMPHS ABS: 1.1 10*3/uL (ref 0.7–4.0)
LYMPHS PCT: 13 % (ref 12–46)
Monocytes Absolute: 0.7 10*3/uL (ref 0.1–1.0)
Monocytes Relative: 9 % (ref 3–12)
Neutro Abs: 6.1 10*3/uL (ref 1.7–7.7)
Neutrophils Relative %: 76 % (ref 43–77)

## 2014-02-08 LAB — PHOSPHORUS: Phosphorus: 2.9 mg/dL (ref 2.3–4.6)

## 2014-02-08 LAB — MAGNESIUM: Magnesium: 1.7 mg/dL (ref 1.5–2.5)

## 2014-02-08 LAB — C4 COMPLEMENT: Complement C4, Body Fluid: 21 mg/dL (ref 10–40)

## 2014-02-08 LAB — TRIGLYCERIDES: Triglycerides: 114 mg/dL (ref <150)

## 2014-02-08 LAB — C3 COMPLEMENT: C3 Complement: 76 mg/dL — ABNORMAL LOW (ref 90–180)

## 2014-02-08 SURGERY — MUSCLE BIOPSY
Anesthesia: Monitor Anesthesia Care | Site: Arm Upper

## 2014-02-08 MED ORDER — FUROSEMIDE 10 MG/ML IJ SOLN
20.0000 mg | Freq: Two times a day (BID) | INTRAMUSCULAR | Status: DC
  Administered 2014-02-09 – 2014-02-13 (×9): 20 mg via INTRAVENOUS
  Filled 2014-02-08 (×12): qty 2

## 2014-02-08 MED ORDER — ETOMIDATE 2 MG/ML IV SOLN
INTRAVENOUS | Status: AC
  Filled 2014-02-08: qty 10

## 2014-02-08 MED ORDER — SODIUM CHLORIDE 0.9 % IJ SOLN
10.0000 mL | Freq: Two times a day (BID) | INTRAMUSCULAR | Status: DC
  Administered 2014-02-09 – 2014-02-13 (×6): 10 mL

## 2014-02-08 MED ORDER — MUPIROCIN 2 % EX OINT
TOPICAL_OINTMENT | Freq: Once | CUTANEOUS | Status: AC
  Administered 2014-02-08: 1 via NASAL
  Filled 2014-02-08: qty 22

## 2014-02-08 MED ORDER — M.V.I. ADULT IV INJ
INJECTION | INTRAVENOUS | Status: AC
  Administered 2014-02-08: 20:00:00 via INTRAVENOUS
  Filled 2014-02-08: qty 1000

## 2014-02-08 MED ORDER — SODIUM CHLORIDE 0.9 % IJ SOLN
10.0000 mL | INTRAMUSCULAR | Status: DC | PRN
  Administered 2014-02-09 – 2014-02-11 (×3): 10 mL

## 2014-02-08 MED ORDER — LIDOCAINE HCL 1 % IJ SOLN
INTRAMUSCULAR | Status: DC | PRN
  Administered 2014-02-08: 5 mL

## 2014-02-08 MED ORDER — FAT EMULSION 20 % IV EMUL
250.0000 mL | INTRAVENOUS | Status: AC
  Administered 2014-02-08: 250 mL via INTRAVENOUS
  Filled 2014-02-08: qty 250

## 2014-02-08 MED ORDER — KETAMINE HCL 10 MG/ML IJ SOLN
INTRAMUSCULAR | Status: AC
  Filled 2014-02-08: qty 1

## 2014-02-08 MED ORDER — LIDOCAINE HCL (CARDIAC) 20 MG/ML IV SOLN
INTRAVENOUS | Status: AC
  Filled 2014-02-08: qty 5

## 2014-02-08 MED ORDER — LACTATED RINGERS IV SOLN
INTRAVENOUS | Status: DC
  Administered 2014-02-08: 1000 mL via INTRAVENOUS

## 2014-02-08 MED ORDER — FENTANYL CITRATE 0.05 MG/ML IJ SOLN
INTRAMUSCULAR | Status: AC
  Filled 2014-02-08: qty 2

## 2014-02-08 MED ORDER — KETAMINE HCL 10 MG/ML IJ SOLN
INTRAMUSCULAR | Status: DC | PRN
  Administered 2014-02-08: 10 mg via INTRAVENOUS

## 2014-02-08 MED ORDER — PROPOFOL 10 MG/ML IV BOLUS
INTRAVENOUS | Status: AC
  Filled 2014-02-08: qty 20

## 2014-02-08 MED ORDER — INSULIN ASPART 100 UNIT/ML ~~LOC~~ SOLN
0.0000 [IU] | SUBCUTANEOUS | Status: DC
  Administered 2014-02-09: 3 [IU] via SUBCUTANEOUS
  Administered 2014-02-09 (×2): 2 [IU] via SUBCUTANEOUS
  Administered 2014-02-09 (×3): 3 [IU] via SUBCUTANEOUS
  Administered 2014-02-10 (×5): 2 [IU] via SUBCUTANEOUS
  Administered 2014-02-11 (×2): 3 [IU] via SUBCUTANEOUS
  Administered 2014-02-11: 2 [IU] via SUBCUTANEOUS
  Administered 2014-02-11: 3 [IU] via SUBCUTANEOUS
  Administered 2014-02-12 (×2): 2 [IU] via SUBCUTANEOUS
  Administered 2014-02-13: 3 [IU] via SUBCUTANEOUS
  Administered 2014-02-13: 2 [IU] via SUBCUTANEOUS

## 2014-02-08 SURGICAL SUPPLY — 35 items
APL SKNCLS STERI-STRIP NONHPOA (GAUZE/BANDAGES/DRESSINGS) ×1
BENZOIN TINCTURE PRP APPL 2/3 (GAUZE/BANDAGES/DRESSINGS) ×2 IMPLANT
BLADE HEX COATED 2.75 (ELECTRODE) ×3 IMPLANT
BLADE SURG 15 STRL LF DISP TIS (BLADE) ×1 IMPLANT
BLADE SURG 15 STRL SS (BLADE) ×3
CANISTER SUCTION 2500CC (MISCELLANEOUS) ×3 IMPLANT
CLOSURE STERI-STRIP 1/4X4 (GAUZE/BANDAGES/DRESSINGS) ×2 IMPLANT
DECANTER SPIKE VIAL GLASS SM (MISCELLANEOUS) IMPLANT
DRAPE LAPAROTOMY T 102X78X121 (DRAPES) IMPLANT
DRAPE PED LAPAROTOMY (DRAPES) ×2 IMPLANT
DRSG OPSITE POSTOP 4X6 (GAUZE/BANDAGES/DRESSINGS) ×2 IMPLANT
ELECT REM PT RETURN 9FT ADLT (ELECTROSURGICAL) ×3
ELECTRODE REM PT RTRN 9FT ADLT (ELECTROSURGICAL) ×1 IMPLANT
GAUZE PACKING IODOFORM 1/4X15 (GAUZE/BANDAGES/DRESSINGS) IMPLANT
GLOVE BIO SURGEON STRL SZ 6.5 (GLOVE) ×2 IMPLANT
GLOVE BIO SURGEONS STRL SZ 6.5 (GLOVE) ×1
GLOVE BIOGEL M STRL SZ7.5 (GLOVE) IMPLANT
GLOVE BIOGEL PI IND STRL 7.0 (GLOVE) ×1 IMPLANT
GLOVE BIOGEL PI INDICATOR 7.0 (GLOVE) ×2
GOWN SPEC L3 XXLG W/TWL (GOWN DISPOSABLE) ×3 IMPLANT
GOWN STRL REUS W/TWL XL LVL3 (GOWN DISPOSABLE) ×6 IMPLANT
KIT BASIN OR (CUSTOM PROCEDURE TRAY) ×3 IMPLANT
MARKER SKIN DUAL TIP RULER LAB (MISCELLANEOUS) IMPLANT
NDL HYPO 25X1 1.5 SAFETY (NEEDLE) ×1 IMPLANT
NEEDLE HYPO 25X1 1.5 SAFETY (NEEDLE) ×3 IMPLANT
PACK BASIC VI WITH GOWN DISP (CUSTOM PROCEDURE TRAY) ×3 IMPLANT
PENCIL BUTTON HOLSTER BLD 10FT (ELECTRODE) ×3 IMPLANT
SPONGE GAUZE 4X4 12PLY (GAUZE/BANDAGES/DRESSINGS) ×3 IMPLANT
SPONGE LAP 18X18 X RAY DECT (DISPOSABLE) IMPLANT
STAPLER VISISTAT 35W (STAPLE) ×3 IMPLANT
SUT MNCRL AB 4-0 PS2 18 (SUTURE) ×2 IMPLANT
SUT VIC AB 3-0 SH 18 (SUTURE) ×2 IMPLANT
SYR CONTROL 10ML LL (SYRINGE) ×3 IMPLANT
TOWEL OR 17X26 10 PK STRL BLUE (TOWEL DISPOSABLE) ×5 IMPLANT
YANKAUER SUCT BULB TIP 10FT TU (MISCELLANEOUS) IMPLANT

## 2014-02-08 NOTE — Anesthesia Postprocedure Evaluation (Signed)
  Anesthesia Post-op Note  Patient: Ethan Mclean  Procedure(s) Performed: Procedure(s) (LRB): MUSCLE BIOPSY (N/A)  Patient Location: PACU  Anesthesia Type: MAC  Level of Consciousness: awake and alert   Airway and Oxygen Therapy: Patient Spontanous Breathing  Post-op Pain: mild  Post-op Assessment: Post-op Vital signs reviewed, Patient's Cardiovascular Status Stable, Respiratory Function Stable, Patent Airway and No signs of Nausea or vomiting  Last Vitals:  Filed Vitals:   02/08/14 1250  BP: 119/84  Pulse: 76  Temp: 37.3 C  Resp:     Post-op Vital Signs: stable   Complications: No apparent anesthesia complications

## 2014-02-08 NOTE — Progress Notes (Signed)
PROGRESS NOTE  Ethan Mclean ZOX:[id number] DOB: 01-15-1926 DOA: 02/06/2014 PCP: Lenora BoysFRIED, ROBERT L, MD  HPI: Ethan Mclean is a 80 y.o. male has a past medical history significant for systolic heart failure (EF 25-30% on 4/26), HTN, DM, admitted after had a nerve conduction study in the [facility] office. He was discharged from the hospital service approximately 3 weeks ago after a hospitalization for diarrhea, went to a SNF where he was found to have inability to initiate swallowing function based on a MBS study. He was supposed to see neurology as an outpatient however was unable to get there. As previously described in Dr. Chancy MilroyKrishnan's H&P, Mr. Ethan Mclean had progressively worsening loss of motor function of his lower extremities to the point now where he could barely move his feet. This has not affected his sensation. He also noticed progressively worsening decline in upper extremity function, but still has some strength and loss of fine motor control and decreasing gross motor control.  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  Severe protein-calorie malnutrition - several failed attempts were made for placement of a post pyloric NG tube by nursing staff as well as radiologist without success, will start TNP via PICC line.  Systolic heart failure - gentle hydration 5/21, renal function better this morning, stop IVF - restart Lasix once TNA initiated, first dose tonight. Dysphagia - due to #1.  Thrombocytopenia - likely in the setting of severe malnutrition, improving Goals of care: I discussed extensively with the patient and his family that biopsy might shed some light into the etiology of his illness and this may or may not be reversible. They understand that the PICC Line and TNA is  temporary and if they decide on long term treatment based on Neurology recommendations and biopsy results a PEG tube would be preferred for feeding; patient and his wife are concerned about PEG tube if this is not treatable and hospice is reasonable at that point.   Diet: NPO Fluids: none DVT Prophylaxis: SCD  Code Status: DNR Family Communication: wife at bedside  Disposition Plan: inpatient  Consultants:  Neurology   Surgery   Procedures:  None    Antibiotics - none  HPI/Subjective: - feels weak  Objective: Filed Vitals:   02/07/14 1414 02/07/14 2134 02/08/14 0625 02/08/14 0900  BP: 126/83 126/83 128/75 129/73  Pulse: 89 96 62 91  Temp: 97.6 F (36.4 C) 98.4 F (36.9 C) 97.7 F (36.5 C) 97.4 F (36.3 C)  TempSrc: Oral Oral Oral Oral  Resp: 18 18 18 18   Height:      Weight:   88.95 kg (196 lb 1.6 oz)   SpO2: 97% 95% 98% 97%    Intake/Output Summary (Last 24 hours) at 02/08/14 1104 Last data filed at 02/08/14 0900  Gross per 24 hour  Intake      0 ml  Output    275 ml  Net   -275 ml   Filed Weights   02/06/14 1728 02/08/14 0625  Weight: 89 kg (196 lb 3.4 oz) 88.95 kg (196 lb 1.6 oz)   Exam:  General:  NAD  Cardiovascular: regular rate and rhythm, without MRG  Respiratory: good air movement, clear to auscultation throughout, no wheezing, ronchi or rales  Abdomen: soft, not tender to palpation, positive bowel sounds  MSK: 2+ pitting LE edema  Neuro: weakness LE > UE  Data Reviewed: Basic Metabolic Panel:  Recent Labs Lab 02/04/14 1809 02/06/14 0353 02/07/14 0502 02/08/14 0440  NA 134* 138 141 138  K 4.4 4.3 4.5 4.4  CL 98 101 104 103  CO2 22 22 22 23   GLUCOSE 95 75 94 79  BUN 58* 55* 51* 44*  CREATININE 1.42* 1.40* 1.18 1.08  CALCIUM 8.7 8.9 9.0 9.2  MG  --   --   --  1.7  PHOS  --   --   --  2.9   Liver Function Tests:  Recent Labs Lab 02/07/14 0502 02/08/14 0440  AST 14 17  ALT 11 10  ALKPHOS 54 55  BILITOT 0.5 0.6    PROT 6.0 6.0  ALBUMIN 2.4* 2.6*   CBC:  Recent Labs Lab 02/04/14 1809 02/07/14 0502 02/08/14 0440  WBC 9.0 8.3 8.0  NEUTROABS  --   --  6.1  HGB 12.7* 12.2* 12.6*  HCT 36.4* 36.0* 36.8*  MCV 91.5 93.0 92.5  PLT 104* 119* 123*   Cardiac Enzymes:  Recent Labs Lab 02/06/14 1807  CKTOTAL 57   BNP (last 3 results)  Recent Labs  01/11/14 1444 01/15/14 0700 02/04/14 1809  PROBNP 9946.0* 2541.0* 5728.0*   Studies: Dg Fluoro Rm 1-60 Min - No Report  02/07/2014   CLINICAL DATA: would like a post pyloric NJ for feeding. thank you.   FLUORO ROOM 1-60 MINUTES  Fluoroscopy was utilized by the requesting physician.  No radiographic  interpretation.    Scheduled Meds: .  ceFAZolin (ANCEF) IV  2 g Intravenous On Call to OR   Continuous Infusions: . feeding supplement (VITAL 1.5 CAL)    . lactated ringers 1,000 mL (02/08/14 1037)   Active Problems:   DYSLIPIDEMIA   Chronic systolic heart failure   Bilateral leg weakness   Bilateral arm weakness   Protein-calorie malnutrition, severe   Neuropathy  Time spent: 25  This note has been created with Education officer, environmental. Any transcriptional errors are unintentional.   Pamella Pert, MD [facility] Pager [phone]. If 7 PM - 7 AM, please contact night-coverage at [email or web address], password [facility] 02/08/2014, 11:04 AM  LOS: 2 days

## 2014-02-08 NOTE — Progress Notes (Signed)
Peripherally Inserted Central Catheter/Midline Placement  The IV Nurse has discussed with the patient and/or persons authorized to consent for the patient, the purpose of this procedure and the potential benefits and risks involved with this procedure.  The benefits include less needle sticks, lab draws from the catheter and patient may be discharged home with the catheter.  Risks include, but not limited to, infection, bleeding, blood clot (thrombus formation), and puncture of an artery; nerve damage and irregular heat beat.  Alternatives to this procedure were also discussed.  PICC/Midline Placement Documentation        Lisabeth Devoid 02/08/2014, 2:16 PM Consent obtained from wife at bedside

## 2014-02-08 NOTE — Progress Notes (Addendum)
Telephone report given to [facility], informed patient and his spouse regarding imminent transfer to [facility].

## 2014-02-08 NOTE — H&P (View-Only) (Signed)
ATTENDING ADDENDUM:  I personally reviewed patient's record, examined the patient, and formulated the following assessment and plan:  Can do muscle bx in AM.  Will coordinate with pathology.  NPO p MN

## 2014-02-08 NOTE — H&P (View-Only) (Signed)
Reason for Consult:  Muscle biopsy for evaluation of inflammatory myositis Referring Physician:  Dr. Terance Ice  Ethan Mclean is an 80 y.o. male.  HPI: Unfortunate gentleman with progressive weakness both lower legs, and now unable to swallow.  Nerve conduction studies on upper and lower extremities show a severe axonal peripheral neuropathy, consistent with an inflammatory myositis.  They recommended deltoid or bicep muscle biopsy. We are ask to see. He has multiple medical issues as listed below.  Past Medical History  Diagnosis Date  . CHF (congestive heart failure)   . DVT (deep venous thrombosis)     "legs"  . Complication of anesthesia     "slow to wake up after eye OR"  . Type II diabetes mellitus     "dx'd years ago; don't have it anymroe" (01/11/2014)  . GERD (gastroesophageal reflux disease)   . Gout   . H/O echocardiogram     nonischemi cardiomyopathy EF 40-45%   . Asymptomatic PVCs   . Hypertension   . Statin intolerance   . Balance problems   . Chronic renal insufficiency   . Carpal tunnel syndrome on right   . Gout   . Osteoarthritis   . H/O CHF W/ LE Edema  . Hx of adenomatous colonic polyps   . Chronic edema     LE from chronic  DVTS    Past Surgical History  Procedure Laterality Date  . Appendectomy    . Vena cava filter placement    . Cataract extraction w/ intraocular lens implant Left   . Tonsillectomy    . Eye surgery Bilateral     "to retain tears"  . Cardiac catheterization  12/2001    Normal coronary arteries by cath 2003    Family History  Problem Relation Age of Onset  . Cancer Other     Social History:  reports that he quit smoking about 62 years ago. His smoking use included Cigarettes. He smoked 0.00 packs per day for 5 years. He has never used smokeless tobacco. He reports that he drinks alcohol. He reports that he does not use illicit drugs.  Allergies:  Allergies  Allergen Reactions  . Codeine Phosphate     unknown  .  Iohexol      Desc: contrast induced renal insufficiency in past, entered 12/25/2004 bsw     . Sulfonamide Derivatives     unknown    Medications:  Prior to Admission:  Prescriptions prior to admission  Medication Sig Dispense Refill  . acetaminophen (TYLENOL) 500 MG tablet Take 500 mg by mouth every other day as needed for mild pain.      Marland Kitchen aspirin EC 325 MG tablet Take 325 mg by mouth every other day.      . calcium carbonate (TUMS - DOSED IN MG ELEMENTAL CALCIUM) 500 MG chewable tablet Chew 250 mg by mouth every other day.      Marland Kitchen HYDROcodone-acetaminophen (NORCO/VICODIN) 5-325 MG per tablet Take 1 tablet by mouth every 6 (six) hours as needed for moderate pain.      Marland Kitchen loratadine (CLARITIN) 10 MG tablet Take 10 mg by mouth every evening.      Marland Kitchen LORazepam (ATIVAN) 0.5 MG tablet Take one tablet by mouth twice daily as needed for anxiety  60 tablet  5  . Omega-3 Fatty Acids (OMEGA 3 PO) Take 2 capsules by mouth 2 (two) times daily.      Marland Kitchen omeprazole (PRILOSEC OTC) 20 MG tablet Take 20 mg by  mouth every other day.      Marland Kitchen oxymetazoline (AFRIN) 0.05 % nasal spray Place 1 spray into both nostrils 2 (two) times daily as needed for congestion.      Ethan Mclean Glycol-Propyl Glycol (SYSTANE) 0.4-0.3 % SOLN Place 1 drop into both eyes 2 (two) times daily.      . Probiotic Product (PROBIOTIC COLON SUPPORT) CAPS Take 1 capsule by mouth daily.      . psyllium (METAMUCIL) 58.6 % packet Take 1 packet by mouth daily.      . sodium chloride (OCEAN) 0.65 % SOLN nasal spray Place 1 spray into both nostrils as needed for congestion.       Scheduled:  Continuous:  FIE:[id number] Anti-infectives   None      Results for orders placed during the hospital encounter of 02/06/14 (from the past 48 hour(s))  CK     Status: None   Collection Time    02/06/14  6:07 PM      Result Value Ref Range   Total CK 57  7 - 232 U/L  CBC     Status: Abnormal   Collection Time    02/07/14  5:02 AM      Result Value Ref  Range   WBC 8.3  4.0 - 10.5 K/uL   RBC 3.87 (*) 4.22 - 5.81 MIL/uL   Hemoglobin 12.2 (*) 13.0 - 17.0 g/dL   HCT 36.0 (*) 39.0 - 52.0 %   MCV 93.0  78.0 - 100.0 fL   MCH 31.5  26.0 - 34.0 pg   MCHC 33.9  30.0 - 36.0 g/dL   RDW 13.8  11.5 - 15.5 %   Platelets 119 (*) 150 - 400 K/uL   Comment: CONSISTENT WITH PREVIOUS RESULT  COMPREHENSIVE METABOLIC PANEL     Status: Abnormal   Collection Time    02/07/14  5:02 AM      Result Value Ref Range   Sodium 141  137 - 147 mEq/L   Potassium 4.5  3.7 - 5.3 mEq/L   Chloride 104  96 - 112 mEq/L   CO2 22  19 - 32 mEq/L   Glucose, Bld 94  70 - 99 mg/dL   BUN 51 (*) 6 - 23 mg/dL   Creatinine, Ser 1.18  0.50 - 1.35 mg/dL   Calcium 9.0  8.4 - 10.5 mg/dL   Total Protein 6.0  6.0 - 8.3 g/dL   Albumin 2.4 (*) 3.5 - 5.2 g/dL   AST 14  0 - 37 U/L   ALT 11  0 - 53 U/L   Alkaline Phosphatase 54  39 - 117 U/L   Total Bilirubin 0.5  0.3 - 1.2 mg/dL   GFR calc non Af Amer 54 (*) >90 mL/min   GFR calc Af Amer 63 (*) >90 mL/min   Comment: (NOTE)     The eGFR has been calculated using the CKD EPI equation.     This calculation has not been validated in all clinical situations.     eGFR's persistently <90 mL/min signify possible Chronic Kidney     Disease.    Mr Ethan Mclean Wo Contrast  02/05/2014   CLINICAL DATA:  Difficulty swallowing.  Possible ALS.  EXAM: MRI HEAD WITHOUT AND WITH CONTRAST  TECHNIQUE: Multiplanar, multiecho pulse sequences of the brain and surrounding structures were obtained without and with intravenous contrast.  CONTRAST:  13m MULTIHANCE GADOBENATE DIMEGLUMINE 529 MG/ML IV SOLN  COMPARISON:  None.  FINDINGS: No acute infarct.  No intracranial hemorrhage.  No intracranial MR findings of ALS.  Mild to moderate small vessel disease type changes.  Global atrophy without hydrocephalus.  No intracranial mass or abnormal enhancement.  Major intracranial vascular structures are patent.  Partial opacification mastoid air cells bilaterally without  obstructing lesion noted in the region of the posterior superior nasopharynx causing eustachian tube dysfunction.  Mild transverse ligament hypertrophy. Minimal spur C3-4 with very mild spinal stenosis.  IMPRESSION: No acute infarct.  No intracranial MR findings of ALS.  Mild to moderate small vessel disease type changes.  Global atrophy without hydrocephalus.  No intracranial mass or abnormal enhancement.  Partial opacification mastoid air cells bilaterally   Electronically Signed   By: Chauncey Cruel M.D.   On: 02/05/2014 14:46    Review of Systems  Constitutional: Negative.   All other systems reviewed and are negative.  Blood pressure 121/78, pulse 75, temperature 98.5 F (36.9 C), temperature source Oral, resp. rate 18, height _0  (1.905 m), weight 89 kg (196 lb 3.4 oz), SpO2 96.00%. Physical Exam  Constitutional: He is oriented to person, place, and time.  Elderly bed ridden male, NAD  HENT:  Head: Normocephalic and atraumatic.  Eyes:  He is light sensitive so I could not really keep lights on to examine.  Neck: Normal range of motion. Neck supple. No tracheal deviation present. No thyromegaly present.  Cardiovascular: Normal rate, regular rhythm, normal heart sounds and intact distal pulses.  Exam reveals no gallop.   No murmur heard. Respiratory: Effort normal and breath sounds normal. No respiratory distress. He has no wheezes. He has no rales. He exhibits no tenderness.  GI: Soft. Bowel sounds are normal. He exhibits no distension and no mass. There is no tenderness. There is no rebound and no guarding.  Musculoskeletal: He exhibits no tenderness. Edema: 2-3 edema both lower legs.  Neurological: He is alert and oriented to person, place, and time.  Skin: Skin is warm and dry. No rash noted. No erythema.  Psychiatric: He has a normal mood and affect. His behavior is normal. Judgment and thought content normal.  Family with him, including granddaughter who makes decisions for them.     Assessment/Plan: 1.  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 2.   Severe Protein calorie malnutrition with significant dysphagia. 3.  Severe systolic heart failure with bilateral lower leg edema. 4.  Severe deconditioning   5.  Thrombocytopenia (119K)  Plan:  We will need to talk to pathology and see what they need for this.  Medicine needs to transport him to [facility] so neurology can follow over the long weekend.  We will get back to you as soon as we work out the logistics of getting biopsy and proper pathology evaluation.  Earnstine Regal 02/07/2014, 11:18 AM

## 2014-02-08 NOTE — Progress Notes (Signed)
Spoke with Jesusita Oka at [place] to arrange transportation to [facility] [address].

## 2014-02-08 NOTE — Transfer of Care (Signed)
Immediate Anesthesia Transfer of Care Note  Patient: Ethan Mclean  Procedure(s) Performed: Procedure(s) (LRB): MUSCLE BIOPSY (N/A)  Patient Location: PACU  Anesthesia Type: MAC  Level of Consciousness:patient alert,cooperative and responds to stimulation  Airway & Oxygen Therapy: Patient Spontanous Breathing and Patient connected to face mask oxgen  Post-op Assessment: Report given to PACU RN and Post -op Vital signs reviewed and stable  Post vital signs: Reviewed and stable  Complications: No apparent anesthesia complications

## 2014-02-08 NOTE — Progress Notes (Signed)
Accepted via [facility] / stretcher.  VSS, afebrile, +2-+3 pitting edema both legs.  Sacrum stage 2 red, fragile skin 1.5 cm diameter, no broken skin.  Family instructed on isolation, safety precautions, turn/ re-position q 2hr, oral care & oral suctioning as needed.  Pt c/o pain with moving lower extremeties, SCDs on & heels off bed.

## 2014-02-08 NOTE — Progress Notes (Signed)
Telephone report given to [place] on 4N for patient's transfer to [facility].

## 2014-02-08 NOTE — Interval H&P Note (Signed)
History and Physical Interval Note:  02/08/2014 10:36 AM  Ethan Mclean  has presented today for surgery, with the diagnosis of myositis  The various methods of treatment have been discussed with the patient and family. After consideration of risks, benefits and other options for treatment, the patient has consented to  Procedure(s): MUSCLE BIOPSY (N/A) as a surgical intervention .  The patient's history has been reviewed, patient examined, no change in status, stable for surgery.  I have reviewed the patient's chart and labs.  Questions were answered to the patient's satisfaction.     Vanita Panda, MD  Colorectal and General Surgery [facility]

## 2014-02-08 NOTE — Op Note (Signed)
02/06/2014 - 02/08/2014  11:58 AM  PATIENT:  Ethan Mclean  80 y.o. male  Patient Care Team: [name], MD as PCP - General (Family Medicine) Quintella Reichert, MD as Consulting Physician (Cardiology)  PRE-OPERATIVE DIAGNOSIS:  myositis  POST-OPERATIVE DIAGNOSIS:  myositis  PROCEDURE:  Procedure(s): MUSCLE BIOPSY: R Bicep  SURGEON:  Surgeon(s): Romie Levee, MD  ASSISTANT: none   ANESTHESIA:   local and MAC  EBL:  Total I/O In: 100 [I.V.:100] Out: 275 [Urine:275]  DRAINS: none   SPECIMEN:  Source of Specimen:  R biceps, sent fresh to pathology  DISPOSITION OF SPECIMEN:  PATHOLOGY  COUNTS:  YES  PLAN OF CARE: patient admitted  PATIENT DISPOSITION:  PACU - hemodynamically stable.  INDICATION: [name] is a 80 y.o. male has a past medical history significant for systolic heart failure (EF 25-30% on 4/26), HTN, DM, admitted after had a nerve conduction study in the [facility] office. He was discharged from the hospital service approximately 3 weeks ago after a hospitalization for diarrhea, went to a SNF where he was found to have inability to initiate swallowing function based on a MBS study.  [name] had progressively worsening loss of motor function of his lower extremities to the point now where he could barely move his feet.  We were asked to perform a muscle biopsy.  OR FINDINGS: Normal appearing R biceps muscles tissue  DESCRIPTION: the patient was identified in the preoperative holding area and taken to the OR where they were laid supine on the operating room table.  MAC anesthesia was induced without difficulty. SCDs were also noted to be in place prior to the initiation of anesthesia.  The patient was then prepped and draped in the usual sterile fashion.   A surgical timeout was performed indicating the correct patient, procedure, positioning and need for preoperative antibiotics.   I began by infusing subcutaneous lidocaine in his right upper arm.  A 2 cm  incision was made in the skin using a scalpel.  The muscle fascia was infused with lidocaine as well.  This was then excised sharply.  A 2 cm piece of muscle was removed using scissors.  This was sent to pathology.  Hemostasis was achieved using electrocautery.  The fascia was then closed with interrupted 2-0 Vicryl sutures.  The dermis was re-approximated with 2-0 Vicryl as well.  The skin was closed with a running 4-0 Monocryl suture.  Steri strips and a sterile dressing was applied.  The patient was awakened from anesthesia and sent to the PACU in stable condition.  All counts were correct per OR staff.

## 2014-02-08 NOTE — Progress Notes (Signed)
NUTRITION FOLLOW UP/NEW TPN  Intervention:   - TPN per pharmacist - Recommend close monitoring/repletion of refeeding labs (potassium, phosphorus, and magnesium) as pt at high refeeding risk, discussed with pharmacist who will order these lab values and monitor/replete as needed  - D/C Vital 1.5 TF - RD to continue to monitor  Nutrition Dx:   Inadequate oral intake related to inability to eat as evidenced by NPO - ongoing    Goal:   1. TF to meet >90% of estimated nutritional needs - not met, unable to place NJ tube 2. Refeeding labs WNL once TF initiated - not met, unable to place NJ tube  New goal: 1. TPN to meet >90% of estimated nutritional needs 2. Refeeding labs WNL once TPN initiated    Monitor:   Weights, labs, TPN, diet advancement, PEG  Assessment:   Pt admitted with progressive neuromuscular disorder over several years with severe weakness of lower extremities as well as severe weakness distally of upper extremities and moderate weakness proximally of upper extremities. 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 Hx of CHF, DVT, GERD, type 2 DM, HTN, chronic edema for 4 years per pt, and chronic renal insufficiency.   5/21: -Pt discussed during multidisciplinary rounds.  -Met with pt and wife  -Both report pt has not consumed any solid foods in the past month. Was consuming liquids but got to the point he couldn't even swallow water 2-3 weeks ago  -Pt unsure of usual weight as he has had significant bilateral extremity edema for the past 4 years  -States the most he has ever weighed has been 230 pounds  -Per conversation with MD, plan is to place NJ tube and for RD to manage TF and can order refeeding labs  5/22: -Pt discussed during multidisciplinary rounds -Had SLP bedside swallow evaluation  yesterday and pt was noted to have severe aspiration risk with recommendations for NPO -2 RNs attempted to place post pyloric NJ however were unsuccessful, taken to radiology and radiologist unable to place NJ tube as well -PICC placed to start TPN today -MD discussed with family this morning that PEG is preferred for feeding -Waiting on biopsy results before further decisions made -Pt currently out of room for muscle biopsy   Height: Ht Readings from Last 1 Encounters:  02/06/14 6' 3"  (1.905 m)    Weight Status:   Wt Readings from Last 1 Encounters:  02/08/14 196 lb 1.6 oz (88.95 kg)  Admit wt         196 lb 3.4 oz (89 kg)    Re-estimated needs:  Kcal: 2300-2500  Protein: 115-130g  Fluid: 2.3-2.5L/daily   Skin: +4 RLE, LLE edema, stage 2 sacral pressure ulcer    Diet Order: NPO   Intake/Output Summary (Last 24 hours) at 02/08/14 1242 Last data filed at 02/08/14 1124  Gross per 24 hour  Intake    100 ml  Output    275 ml  Net   -175 ml    Last BM: 5/19   Labs:   Recent Labs Lab 02/06/14 0353 02/07/14 0502 02/08/14 0440  NA 138 141 138  K 4.3 4.5 4.4  CL 101 104 103  CO2 22 22 23   BUN 55* 51* 44*  CREATININE 1.40* 1.18 1.08  CALCIUM 8.9 9.0 9.2  MG  --   --  1.7  PHOS  --   --  2.9  GLUCOSE 75 94 79    CBG (last 3)   Recent Labs  02/08/14 1205  GLUCAP 78    Scheduled Meds: . furosemide  20 mg Intravenous Q12H    Continuous Infusions: . feeding supplement (VITAL 1.5 CAL)    . lactated ringers 1,000 mL (02/08/14 1037)    Ethan Mclean, Ethan Mclean, Ethan Mclean Pager [phone] After Hours Pager

## 2014-02-08 NOTE — Anesthesia Preprocedure Evaluation (Addendum)
Anesthesia Evaluation  Patient identified by MRN, date of birth, ID band Patient awake    Reviewed: Allergy & Precautions, H&P , NPO status , Patient's Chart, lab work & pertinent test results  History of Anesthesia Complications (+) PROLONGED EMERGENCE  Airway Mallampati: II TM Distance: >3 FB Neck ROM: Full    Dental no notable dental hx.    Pulmonary neg pulmonary ROS, former smoker,  RA sat 74%   Pulmonary exam normal   rales    Cardiovascular Exercise Tolerance: Poor hypertension, Pt. on medications +CHF (EF 25%), + Orthopnea, + DOE and DVT Rhythm:Regular Rate:Normal     Neuro/Psych  Neuromuscular disease negative neurological ROS  negative psych ROS   GI/Hepatic negative GI ROS, Neg liver ROS,   Endo/Other  negative endocrine ROSdiabetes  Renal/GU negative Renal ROS  negative genitourinary   Musculoskeletal negative musculoskeletal ROS (+)   Abdominal   Peds negative pediatric ROS (+)  Hematology negative hematology ROS (+)   Anesthesia Other Findings   Reproductive/Obstetrics negative OB ROS                          Anesthesia Physical Anesthesia Plan  ASA: III  Anesthesia Plan: MAC   Post-op Pain Management:    Induction:   Airway Management Planned: Simple Face Mask  Additional Equipment:   Intra-op Plan:   Post-operative Plan:   Informed Consent: I have reviewed the patients History and Physical, chart, labs and discussed the procedure including the risks, benefits and alternatives for the proposed anesthesia with the patient or authorized representative who has indicated his/her understanding and acceptance.   Dental advisory given  Plan Discussed with: CRNA  Anesthesia Plan Comments: (Pt is in florid CHF currently. RA sats 74%. 4+ bilat pitting edema and crackles bilat. After discussion with Dr. Maisie Fus we will procede with biopsy under minimal sedation in  semi-recumbent position under local anesthesia.)        Anesthesia Quick Evaluation

## 2014-02-08 NOTE — Progress Notes (Signed)
Patient's wife and son present for instructions on the PICC insertion and the explanation regarding surgical muscle biopsy.  Glasses placed in bedside table.  Family left to go home.

## 2014-02-08 NOTE — Progress Notes (Signed)
Lab Tech unable to obtain blood for lab orders placed by Dr. Arlean Hopping.  Spoke with Dr. Sharl Ma who deferred question regarding labs to renal doctors.  Paged Dr. Kathrene Bongo who stated to cancel labs at this time and will evaluate in am.  Notified Mary in the lab

## 2014-02-08 NOTE — Progress Notes (Signed)
Mupricon ointment tubed to OR to initiate use, Jeanice Lim made aware

## 2014-02-08 NOTE — Progress Notes (Signed)
PARENTERAL NUTRITION CONSULT NOTE - Follow up  Pharmacy Consult for TNA Indication: Severe protein-calorie malnutrition  Allergies  Allergen Reactions  . Codeine Phosphate     unknown  . Iohexol      Desc: contrast induced renal insufficiency in past, entered 12/25/2004 bsw     . Sulfonamide Derivatives     unknown    Patient Measurements: Height: 6\' 3"  (190.5 cm) Weight: 196 lb 1.6 oz (88.95 kg) IBW/kg (Calculated) : 84.5 Usual Weight: 197 lb  Vital Signs: Temp: 97.4 F (36.3 C) (05/22 0900) Temp src: Oral (05/22 0900) BP: 129/73 mmHg (05/22 0900) Pulse Rate: 91 (05/22 0900) Intake/Output from previous day: 05/21 0701 - 05/22 0700 In: -  Out: 75 [Urine:75] Intake/Output from this shift: Total I/O In: -  Out: 275 [Urine:275]  Labs:  Recent Labs  02/07/14 0502 02/08/14 0440  WBC 8.3 8.0  HGB 12.2* 12.6*  HCT 36.0* 36.8*  PLT 119* 123*     Recent Labs  02/06/14 0353 02/07/14 0502 02/08/14 0440  NA 138 141 138  K 4.3 4.5 4.4  CL 101 104 103  CO2 22 22 23   GLUCOSE 75 94 79  BUN 55* 51* 44*  CREATININE 1.40* 1.18 1.08  CALCIUM 8.9 9.0 9.2  MG  --   --  1.7  PHOS  --   --  2.9  PROT  --  6.0 6.0  ALBUMIN  --  2.4* 2.6*  AST  --  14 17  ALT  --  11 10  ALKPHOS  --  54 55  BILITOT  --  0.5 0.6  TRIG  --   --  114   Estimated Creatinine Clearance: 59.8 ml/min (by C-G formula based on Cr of 1.08).   No results found for this basename: GLUCAP,  in the last 72 hours  Medical History: Past Medical History  Diagnosis Date  . CHF (congestive heart failure)   . DVT (deep venous thrombosis)     "legs"  . Complication of anesthesia     "slow to wake up after eye OR"  . Type II diabetes mellitus     "dx'd years ago; don't have it anymroe" (01/11/2014)  . GERD (gastroesophageal reflux disease)   . Gout   . H/O echocardiogram     nonischemi cardiomyopathy EF 40-45%   . Asymptomatic PVCs   . Hypertension   . Statin intolerance   . Balance problems    . Chronic renal insufficiency   . Carpal tunnel syndrome on right   . Gout   . Osteoarthritis   . H/O CHF W/ LE Edema  . Hx of adenomatous colonic polyps   . Chronic edema     LE from chronic  DVTS    Medications:  Scheduled:  .  ceFAZolin (ANCEF) IV  2 g Intravenous On Call to OR  . mupirocin ointment   Nasal Once   Infusions:  . feeding supplement (VITAL 1.5 CAL)     PRN: albuterol  Insulin Requirements in the past 24 hours:  No insulin ordered, CBGs ok.  Current Nutrition:  TF: Vital 1.5 cal ordered - *Unable to place enteral feeding access*  IVF: none  Assessment: 80 yo M with progressive neuromuscular disorder over several years with severe weakness of lower extremities as well as severe weakness distally of upper extremities and moderate weakness proximally of upper extremities. 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 Patient was discharged from the hospital service approximately 3 weeks ago after a hospitalization for diarrhea, went to a SNF where he was found to have inability to initiate swallowing function based on a MBS study.  Patient has not consumed any solid foods in the past month, and limited liquids.  Attempted to start enteral feedings today but unable to place post pyloric NJ tube for feeding.  TNA ordered per pharmacy on 5/21 but unable to place PICC. 5/22 to OR for muscle biopsy then PICC to be placed. [facility]   Labs Electrolytes: WNL, Corr Ca 10.3 Renal: Hx CKD, SCr currently WNL, BUN elevated, CrCl 60 ml/min  Liver: LFTs WNL TG: 114 Pre-albumin: in process Glucose: remote hx of DM. CBGs 78-94.  Nutritional Goals:  Per RD(5/21): Kcal: 2300-2500, Protein: 115-130g, Fluid: 2.3-2.5L/daily. Clinimix 5/20 at a  goal rate of 490ml/hr + 20% fat emulsion at 3410ml/hr to provide: 108g/day protein, 2380Kcal/day.  TPN Access: PICC TPN day#: 1  Plan: At 1800 today:  Start Clinimix 5/20 at 4840ml/hr.  20% fat emulsion at 3810ml/hr.  At significant risk of refeeding syndrome so plan to advance carefully to the goal rate.  TNA to contain standard multivitamins and trace elements.  Add moderate SSI q4h.   Check full set of labs in the AM.  TNA lab panels on Mondays & Thursdays.  F/u daily.  Charolotte Ekeom Bellamie Turney, PharmD, pager [phone]. 02/08/2014,3:08 PM.

## 2014-02-08 NOTE — Progress Notes (Signed)
Patient received from PACU , alert, oriented, speaking with wife and son.  Right bicep dressing clean dry and intact, no drainage noted, no swelling observed,  Informed IV therapy patient had returned to room for PICC insertion.  Spoke with patient and his family regarding the process for transfer to [facility].  Patient has no complaints of pain at this time

## 2014-02-08 NOTE — Progress Notes (Signed)
Attempted to call report to staff on [id number], no one aware patient has been assigned asked that I call back.

## 2014-02-09 DIAGNOSIS — I509 Heart failure, unspecified: Secondary | ICD-10-CM

## 2014-02-09 DIAGNOSIS — I5023 Acute on chronic systolic (congestive) heart failure: Secondary | ICD-10-CM

## 2014-02-09 LAB — COMPREHENSIVE METABOLIC PANEL
ALBUMIN: 2.6 g/dL — AB (ref 3.5–5.2)
ALT: 9 U/L (ref 0–53)
AST: 16 U/L (ref 0–37)
Alkaline Phosphatase: 59 U/L (ref 39–117)
BILIRUBIN TOTAL: 0.5 mg/dL (ref 0.3–1.2)
BUN: 39 mg/dL — AB (ref 6–23)
CALCIUM: 9.2 mg/dL (ref 8.4–10.5)
CO2: 23 mEq/L (ref 19–32)
Chloride: 104 mEq/L (ref 96–112)
Creatinine, Ser: 0.82 mg/dL (ref 0.50–1.35)
GFR calc Af Amer: >90 mL/min (ref >90)
GFR calc non Af Amer: 78 mL/min — ABNORMAL LOW (ref >90)
Glucose, Bld: 168 mg/dL — ABNORMAL HIGH (ref 70–99)
Potassium: 4.3 mEq/L (ref 3.7–5.3)
Sodium: 138 mEq/L (ref 137–147)
Total Protein: 6.2 g/dL (ref 6.0–8.3)

## 2014-02-09 LAB — PREALBUMIN: Prealbumin: 10.6 mg/dL — ABNORMAL LOW (ref 17.0–34.0)

## 2014-02-09 LAB — MAGNESIUM: Magnesium: 1.6 mg/dL (ref 1.5–2.5)

## 2014-02-09 LAB — GLUCOSE, CAPILLARY
GLUCOSE-CAPILLARY: 131 mg/dL — AB (ref 70–99)
GLUCOSE-CAPILLARY: 157 mg/dL — AB (ref 70–99)
GLUCOSE-CAPILLARY: 168 mg/dL — AB (ref 70–99)
Glucose-Capillary: 172 mg/dL — ABNORMAL HIGH (ref 70–99)
Glucose-Capillary: 161 mg/dL — ABNORMAL HIGH (ref 70–99)
Glucose-Capillary: 156 mg/dL — ABNORMAL HIGH (ref 70–99)

## 2014-02-09 LAB — PHOSPHORUS: Phosphorus: 2.6 mg/dL (ref 2.3–4.6)

## 2014-02-09 MED ORDER — TRACE MINERALS CR-CU-F-FE-I-MN-MO-SE-ZN IV SOLN
INTRAVENOUS | Status: AC
  Administered 2014-02-09: 18:00:00 via INTRAVENOUS
  Filled 2014-02-09: qty 1000

## 2014-02-09 MED ORDER — FAT EMULSION 20 % IV EMUL
120.0000 mL | INTRAVENOUS | Status: AC
  Administered 2014-02-09: 120 mL via INTRAVENOUS
  Filled 2014-02-09: qty 200

## 2014-02-09 NOTE — Progress Notes (Signed)
PROGRESS NOTE  Ethan Mclean Lyn WUJ:[id number] DOB: 04-29-1926 DOA: 02/06/2014 PCP: Lenora BoysFRIED, ROBERT L, MD  HPI: Ethan Mclean Soltys is a 80 y.o. male has a past medical history significant for systolic heart failure (EF 25-30% on 4/26), HTN, DM, admitted after had a nerve conduction study in the [facility] office. He was discharged from the hospital service approximately 3 weeks ago after a hospitalization for diarrhea, went to a SNF where he was found to have inability to initiate swallowing function based on a MBS study. He was supposed to see neurology as an outpatient however was unable to get there. As previously described in Dr. Chancy MilroyKrishnan's H&P, [name] had progressively worsening loss of motor function of his lower extremities to the point now where he could barely move his feet. This has not affected his sensation. He also noticed progressively worsening decline in upper extremity function, but still has some strength and loss of fine motor control and decreasing gross motor control.  Assessment/Plan: Severe primary axonal peripheral neuropathy -  -Case was discussed with Neurology -It appears he has had progressive weakness for some time now, nonambulatory for the last 4 years.  -Underwent muscle biopsy yesterday -Unfortunately limited treatment options.  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  Severe protein-calorie malnutrition  - several failed attempts were made for placement of a post pyloric NG tube by nursing staff as well as radiologist without success -TPN started Systolic heart failure  - gentle hydration 5/21, renal function better this morning, stop IVF - restart Lasix once TNA initiated, first dose tonight. Dysphagia - due to #1.  Thrombocytopenia - likely in the setting of severe malnutrition, improving Goals of care: Discussion held with patient and his family that biopsy might shed some light into the etiology of his illness and this may or may not  be reversible. They understand that the PICC Line and TNA is temporary and if they decide on long term treatment based on Neurology recommendations and biopsy results a PEG tube would be preferred for feeding; patient and his wife are concerned about PEG tube if this is not treatable and hospice is reasonable at that point.   Diet: NPO Fluids: none DVT Prophylaxis: SCD  Code Status: DNR Family Communication: wife at bedside  Disposition Plan: inpatient  Consultants:  Neurology   Surgery   Procedures:  None    Antibiotics - none  HPI/Subjective: - feels weak  Objective: Filed Vitals:   02/09/14 0035 02/09/14 0300 02/09/14 0437 02/09/14 1135  BP: 132/74  112/58 135/84  Pulse: 71  74 85  Temp: 97.4 F (36.3 Mclean)  97.1 F (36.2 Mclean) 97.8 F (36.6 Mclean)  TempSrc: Oral  Oral Oral  Resp: 18  16 16   Height:      Weight:  91.1 kg (200 lb 13.4 oz)    SpO2: 98%  99% 98%    Intake/Output Summary (Last 24 hours) at 02/09/14 1357 Last data filed at 02/09/14 0917  Gross per 24 hour  Intake 393.33 ml  Output    875 ml  Net -481.67 ml   Filed Weights   02/08/14 0625 02/08/14 1933 02/09/14 0300  Weight: 88.95 kg (196 lb 1.6 oz) 90.447 kg (199 lb 6.4 oz) 91.1 kg (200 lb 13.4 oz)   Exam:  General:  NAD  Cardiovascular: regular rate and rhythm, without MRG  Respiratory: good air movement, clear to auscultation throughout, no wheezing, ronchi or rales  Abdomen: soft, not tender to palpation, positive bowel  sounds  MSK: 2+ pitting LE edema  Neuro: 3/5 muscle strength to b/l upper extremities, appears to have flaccid paralysis on b/l lower extremities. Reflexes were absent. No slurred speech or facial droop  Data Reviewed: Basic Metabolic Panel:  Recent Labs Lab 02/04/14 1809 02/06/14 0353 02/07/14 0502 02/08/14 0440 02/09/14 0750  NA 134* 138 141 138 138  K 4.4 4.3 4.5 4.4 4.3  CL 98 101 104 103 104  CO2 22 22 22 23 23   GLUCOSE 95 75 94 79 168*  BUN 58* 55* 51*  44* 39*  CREATININE 1.42* 1.40* 1.18 1.08 0.82  CALCIUM 8.7 8.9 9.0 9.2 9.2  MG  --   --   --  1.7 1.6  PHOS  --   --   --  2.9 2.6   Liver Function Tests:  Recent Labs Lab 02/07/14 0502 02/08/14 0440 02/09/14 0750  AST 14 17 16   ALT 11 10 9   ALKPHOS 54 55 59  BILITOT 0.5 0.6 0.5  PROT 6.0 6.0 6.2  ALBUMIN 2.4* 2.6* 2.6*   CBC:  Recent Labs Lab 02/04/14 1809 02/07/14 0502 02/08/14 0440  WBC 9.0 8.3 8.0  NEUTROABS  --   --  6.1  HGB 12.7* 12.2* 12.6*  HCT 36.4* 36.0* 36.8*  MCV 91.5 93.0 92.5  PLT 104* 119* 123*   Cardiac Enzymes:  Recent Labs Lab 02/06/14 1807  CKTOTAL 57   BNP (last 3 results)  Recent Labs  01/11/14 1444 01/15/14 0700 02/04/14 1809  PROBNP 9946.0* 2541.0* 5728.0*   Studies: Dg Chest Port 1 View  02/08/2014   CLINICAL DATA:  Confirm line placement  EXAM: PORTABLE CHEST - 1 VIEW  COMPARISON:  DG ABD ACUTE W/CHEST dated 01/11/2014  FINDINGS: A PICC line has been placed via the left upper extremity. The catheter tip lies in the mid portion of the SVC. There is no postprocedure complication. The lungs are reasonably well inflated. There is no focal infiltrate. The cardiac silhouette and pulmonary vascularity are normal. There is calcification in the wall of the aortic arch.  IMPRESSION: No postprocedure complication following placement of the left-sided PICC line.   Electronically Signed   By: David  [place]   On: 02/08/2014 15:05   Dg Fluoro Rm 1-60 Min - No Report  02/07/2014   CLINICAL DATA: would like a post pyloric NJ for feeding. thank you.   FLUORO ROOM 1-60 MINUTES  Fluoroscopy was utilized by the requesting physician.  No radiographic  interpretation.    Scheduled Meds: . furosemide  20 mg Intravenous Q12H  . insulin aspart  0-15 Units Subcutaneous 6 times per day  . sodium chloride  10-40 mL Intracatheter Q12H   Continuous Infusions: . Marland KitchenTPN (CLINIMIX-E) Adult     And  . fat emulsion    . Marland KitchenTPN (CLINIMIX-E) Adult 40 mL/hr at  02/08/14 2010   And  . fat emulsion 250 mL (02/08/14 2010)   Active Problems:   DYSLIPIDEMIA   Chronic systolic heart failure   Bilateral leg weakness   Bilateral arm weakness   Protein-calorie malnutrition, severe   Neuropathy  Time spent: 25 min  This note has been created with Education officer, environmental. Any transcriptional errors are unintentional.   Pamella Pert, MD [facility] Pager [phone]. If 7 PM - 7 AM, please contact night-coverage at [email or web address], password [facility] 02/09/2014, 1:57 PM  LOS: 3 days

## 2014-02-09 NOTE — Progress Notes (Signed)
NEURO HOSPITALIST PROGRESS NOTE   SUBJECTIVE:                                                                                                                        No new neurological developments. Had right biceps muscle Bx yesterday.  OBJECTIVE:                                                                                                                           Vital signs in last 24 hours: Temp:  [96.8 F (36 C)-99.1 F (37.3 C)] 97.1 F (36.2 C) (05/23 0437) Pulse Rate:  [36-91] 74 (05/23 0437) Resp:  [11-18] 16 (05/23 0437) BP: (112-136)/(46-98) 112/58 mmHg (05/23 0437) SpO2:  [96 %-100 %] 99 % (05/23 0437) Weight:  [90.447 kg (199 lb 6.4 oz)-91.1 kg (200 lb 13.4 oz)] 91.1 kg (200 lb 13.4 oz) (05/23 0300)  Intake/Output from previous day: 05/22 0701 - 05/23 0700 In: 493.3 [I.V.:100; TPN:393.3] Out: 675 [Urine:675] Intake/Output this shift:   Nutritional status: NPO  Past Medical History  Diagnosis Date  . CHF (congestive heart failure)   . DVT (deep venous thrombosis)     "legs"  . Complication of anesthesia     "slow to wake up after eye OR"  . Type II diabetes mellitus     "dx'd years ago; don't have it anymroe" (01/11/2014)  . GERD (gastroesophageal reflux disease)   . Gout   . H/O echocardiogram     nonischemi cardiomyopathy EF 40-45%   . Asymptomatic PVCs   . Hypertension   . Statin intolerance   . Balance problems   . Chronic renal insufficiency   . Carpal tunnel syndrome on right   . Gout   . Osteoarthritis   . H/O CHF W/ LE Edema  . Hx of adenomatous colonic polyps   . Chronic edema     LE from chronic  DVTS    Neurologic Exam:  Mental Status:  Alert, oriented, thought content appropriate. Speech fluent without evidence of aphasia. Able to follow 3 step commands without difficulty.  Cranial Nerves:  II: Discs flat bilaterally; Visual fields grossly normal, pupils equal, round, reactive to light and  accommodation  III,IV, VI: mild bilateral ptosis present,  extra-ocular motions intact bilaterally  V,VII: continues to have weakness of upper face and orbicularis oculi along with lower face.  VIII: hearing normal bilaterally  IX,X: gag reflex present  XI: bilateral shoulder shrug  XII: midline tongue extension without atrophy or fasciculations  Motor:  Weak neck flexors and weakness of bilateral UE with distal>proximal. Mild to moderate atrophy of upper extremities proximally as well as moderately severe atrophy distally. Patient had severe weakness of lower extremities as well, proximally and distally. Again, no fasciculations were noted. Muscle tone was flaccid throughout.  Sensory: Pinprick and light touch intact throughout, bilaterally  Deep Tendon Reflexes:  0/4 in UE and LE  Plantars:  Mute bilaterally    Lab Results: No results found for this basename: cbc, bmp, coags, chol, tri, ldl, hga1c   Lipid Panel  Recent Labs  02/08/14 0440  TRIG 114    Studies/Results: Dg Chest Port 1 View  02/08/2014   CLINICAL DATA:  Confirm line placement  EXAM: PORTABLE CHEST - 1 VIEW  COMPARISON:  DG ABD ACUTE W/CHEST dated 01/11/2014  FINDINGS: A PICC line has been placed via the left upper extremity. The catheter tip lies in the mid portion of the SVC. There is no postprocedure complication. The lungs are reasonably well inflated. There is no focal infiltrate. The cardiac silhouette and pulmonary vascularity are normal. There is calcification in the wall of the aortic arch.  IMPRESSION: No postprocedure complication following placement of the left-sided PICC line.   Electronically Signed   By: David  [place]   On: 02/08/2014 15:05   Dg Fluoro Rm 1-60 Min - No Report  02/07/2014   CLINICAL DATA: would like a post pyloric NJ for feeding. thank you.   FLUORO ROOM 1-60 MINUTES  Fluoroscopy was utilized by the requesting physician.  No radiographic  interpretation.     MEDICATIONS                                                                                                                        I have reviewed the patient's current medications.  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 No much to offer in terms of pharmacological intervention. Patient has been unable to walk for 4 years. Awaiting Bx results. Will follow up.    Wyatt Portela, MD [facility]  [phone]  02/09/2014, 9:53 AM

## 2014-02-09 NOTE — Progress Notes (Signed)
Foam dsg applied to pt sacrum stage 2 pressure ulcer. Arabella Merles Brieonna Crutcher RN.

## 2014-02-09 NOTE — Progress Notes (Signed)
Pt moved from reg bed to air mattress bed. Pt voices comfortable and and granddaughter at bedside. Call light within reach. Will continue to monitor quietly. P. Amo Avril Busser Rn.

## 2014-02-09 NOTE — Progress Notes (Signed)
PARENTERAL NUTRITION CONSULT NOTE - FOLLOW UP  Pharmacy Consult for TPN Indication: Severe protein calorie malnutrition, unable to obtain enteral access  Allergies  Allergen Reactions  . Codeine Phosphate     unknown  . Iohexol      Desc: contrast induced renal insufficiency in past, entered 12/25/2004 bsw     . Sulfonamide Derivatives     unknown    Patient Measurements: Height: 6\' 3"  (190.5 cm) Weight: 200 lb 13.4 oz (91.1 kg) IBW/kg (Calculated) : 84.5 Adjusted Body Weight:  Usual Weight:   Vital Signs: Temp: 97.1 F (36.2 C) (05/23 0437) Temp src: Oral (05/23 0437) BP: 112/58 mmHg (05/23 0437) Pulse Rate: 74 (05/23 0437) Intake/Output from previous day: 05/22 0701 - 05/23 0700 In: 493.3 [I.V.:100; TPN:393.3] Out: 675 [Urine:675] Intake/Output from this shift: Total I/O In: -  Out: 475 [Urine:475]  Labs:  Recent Labs  02/07/14 0502 02/08/14 0440  WBC 8.3 8.0  HGB 12.2* 12.6*  HCT 36.0* 36.8*  PLT 119* 123*     Recent Labs  02/07/14 0502 02/08/14 0440 02/09/14 0750  NA 141 138 138  K 4.5 4.4 4.3  CL 104 103 104  CO2 22 23 23   GLUCOSE 94 79 168*  BUN 51* 44* 39*  CREATININE 1.18 1.08 0.82  CALCIUM 9.0 9.2 9.2  MG  --  1.7 1.6  PHOS  --  2.9 2.6  PROT 6.0 6.0 6.2  ALBUMIN 2.4* 2.6* 2.6*  AST 14 17 16   ALT 11 10 9   ALKPHOS 54 55 59  BILITOT 0.5 0.6 0.5  PREALBUMIN  --  10.6*  --   TRIG  --  114  --    Estimated Creatinine Clearance: 78.7 ml/min (by C-G formula based on Cr of 0.82).    Recent Labs  02/08/14 2105 02/09/14 0029 02/09/14 0422  GLUCAP 95 131* 172*    Medications:  Scheduled:  . furosemide  20 mg Intravenous Q12H  . insulin aspart  0-15 Units Subcutaneous 6 times per day  . sodium chloride  10-40 mL Intracatheter Q12H    Insulin Requirements in the past 24 hours:  4 units of mod SSI since TPN initiated, no insulin in TPN  Current Nutrition:  Clinimix-E 5/20 at 4340ml/hr, goal rate of 6690ml/hr + 20% fat emulsion at  5810ml/hr to provide:  108g/day protein, 2380Kcal/day.   Nutritional Goals:  2300-2500 kCal, 115-130 grams of protein per day (estimated from TF rec per RD on 5/21)  Assessment: 80yo with progressive neuromuscular disorder, unable to walk x 4y; awaiting bicep muscle bx results.  Recently hospitalized for diarrhea and discharged to SNF ~3wks ago, where unable to initiate swallowing fxn on a MBS study.  Pt has consumed no solid foods x 1 month and limited liquids.  Enteral feeds were attempted, but unable to place post-pyloric feeding tube.  [facility], currently receiving first bag of TPN.  GI:  GERD.  NPO; could not obtain enteral access  Endo:  DM.  CBG 131 and 172 on minimum rate of TPN  Lytes:  K 4.3, Phos 2.6, Mg 1.6.  Pt is at risk for re-feeding syndrome  Renal:  CRI.  Cr < 1 (1.4 on 5/20).  UOP 0.913ml/kg/hr  Pulm:  RA  Cards:  CHF, HTN.  112/58, 74.  Lasix IV q12  Hepatobil:  LFTs wnl  Neuro:  Progressive neuromuscular d/o, awaiting muscle bx results  ID:  AFeb, WBC wnl, on no abx  Best Practices:  TPN Access:  PICC-double lumen (5/22) TPN day#: 1   Plan:  1- Continue Clinimix-E 5/20 at 61ml/hr with Lipids 5 ml/hr due to CBG > 150 2-  Add 8 units insulin per bag and continue mod SSI q4 3-  BMet, Mg, Phos in AM 4-  TPN labs Mon, Thurs 5-  F/U baseline PAlb  Marisue Humble, PharmD Clinical Pharmacist [facility]

## 2014-02-10 DIAGNOSIS — G589 Mononeuropathy, unspecified: Secondary | ICD-10-CM

## 2014-02-10 DIAGNOSIS — D693 Immune thrombocytopenic purpura: Secondary | ICD-10-CM

## 2014-02-10 DIAGNOSIS — I10 Essential (primary) hypertension: Secondary | ICD-10-CM

## 2014-02-10 DIAGNOSIS — R29898 Other symptoms and signs involving the musculoskeletal system: Secondary | ICD-10-CM

## 2014-02-10 DIAGNOSIS — R1310 Dysphagia, unspecified: Secondary | ICD-10-CM

## 2014-02-10 LAB — BASIC METABOLIC PANEL
BUN: 37 mg/dL — AB (ref 6–23)
CHLORIDE: 106 meq/L (ref 96–112)
CO2: 27 mEq/L (ref 19–32)
CREATININE: 0.92 mg/dL (ref 0.50–1.35)
Calcium: 9.1 mg/dL (ref 8.4–10.5)
GFR, EST AFRICAN AMERICAN: 87 mL/min — AB (ref >90)
GFR, EST NON AFRICAN AMERICAN: 75 mL/min — AB (ref >90)
GLUCOSE: 156 mg/dL — AB (ref 70–99)
POTASSIUM: 4 meq/L (ref 3.7–5.3)
Sodium: 143 mEq/L (ref 137–147)

## 2014-02-10 LAB — MAGNESIUM: Magnesium: 1.5 mg/dL (ref 1.5–2.5)

## 2014-02-10 LAB — GLUCOSE, CAPILLARY
GLUCOSE-CAPILLARY: 141 mg/dL — AB (ref 70–99)
GLUCOSE-CAPILLARY: 143 mg/dL — AB (ref 70–99)
GLUCOSE-CAPILLARY: 136 mg/dL — AB (ref 70–99)
Glucose-Capillary: 117 mg/dL — ABNORMAL HIGH (ref 70–99)
Glucose-Capillary: 139 mg/dL — ABNORMAL HIGH (ref 70–99)
Glucose-Capillary: 146 mg/dL — ABNORMAL HIGH (ref 70–99)

## 2014-02-10 LAB — CBC
HCT: 34.6 % — ABNORMAL LOW (ref 39.0–52.0)
HEMOGLOBIN: 12.1 g/dL — AB (ref 13.0–17.0)
MCH: 32.6 pg (ref 26.0–34.0)
MCHC: 35 g/dL (ref 30.0–36.0)
MCV: 93.3 fL (ref 78.0–100.0)
Platelets: 110 10*3/uL — ABNORMAL LOW (ref 150–400)
RBC: 3.71 MIL/uL — AB (ref 4.22–5.81)
RDW: 14.1 % (ref 11.5–15.5)
WBC: 8.7 10*3/uL (ref 4.0–10.5)

## 2014-02-10 MED ORDER — FAT EMULSION 20 % IV EMUL
240.0000 mL | INTRAVENOUS | Status: AC
  Administered 2014-02-10: 240 mL via INTRAVENOUS
  Filled 2014-02-10 (×2): qty 250

## 2014-02-10 MED ORDER — TRACE MINERALS CR-CU-F-FE-I-MN-MO-SE-ZN IV SOLN
INTRAVENOUS | Status: AC
  Administered 2014-02-10: 18:00:00 via INTRAVENOUS
  Filled 2014-02-10: qty 2000

## 2014-02-10 NOTE — Progress Notes (Signed)
IR team aware of request for G-tube placement. Will review chart and relevant imaging. Can tentatively plan for Perc G-tube placement on Tues 5/26. IR team will evaluate pt and discuss procedure. Pt currently receiving TPN.  Brayton El PA-C Interventional Radiology 02/10/2014 9:53 AM

## 2014-02-10 NOTE — Progress Notes (Signed)
PROGRESS NOTE  Ethan BaconDewey C Mak AVW:[id number] DOB: Jun 22, 1926 DOA: 02/06/2014 PCP: Lenora BoysFRIED, ROBERT L, MD  HPI: Ethan Mclean is a 80 y.o. male has a past medical history significant for systolic heart failure (EF 25-30% on 4/26), HTN, DM, admitted after had a nerve conduction study in the [facility] office. He was discharged from the hospital service approximately 3 weeks ago after a hospitalization for diarrhea, went to a SNF where he was found to have inability to initiate swallowing function based on a MBS study. He was supposed to see neurology as an outpatient however was unable to get there. As previously described in Dr. Chancy MilroyKrishnan's H&P, Ethan Mclean had progressively worsening loss of motor function of his lower extremities to the point now where he could barely move his feet. This has not affected his sensation. He also noticed progressively worsening decline in upper extremity function, but still has some strength and loss of fine motor control and decreasing gross motor control.  Assessment/Plan: Severe primary axonal peripheral neuropathy -  -Case was discussed with Neurology -It appears he has had progressive weakness for some time now, nonambulatory for the last 4 years.  -Underwent muscle biopsy, results are pending -Unfortunately limited treatment options.   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   Severe protein-calorie malnutrition  -TPN started -Re-consult IR for placement of feeding tube   Systolic heart failure  -Compensated  Thrombocytopenia - likely in the setting of severe malnutrition, improving  Goals of care: Extensive discussion held today regarding goals of care with patient and his wife who was present at bedside. I explained results of nerve conduction test and diagnosis of severe primary axonal neuropathy and myositis. I conveyed to them that treatment options where limited and that neurology could not offer pharmacologic intervention  that would reverse underlying progressive neurologic disorder. He expressed desire to focus his care on comfort and quality of life rather than undergoing further invasive/burdensome procedures that would not change endpoint. As for feeding he wishes to proceed with PEG tube placement. IR consult made for PEG placement in am.   Overall he would like to go home with home hospice services and focus on quality of life. He is interested to speaking with hospice and palliative care further. Palliative care consult placed. SW consult for setting up home hospice.      Diet: NPO Fluids: none DVT Prophylaxis: SCD  Code Status: DNR Family Communication: wife at bedside  Disposition Plan: Discharge home with home hospice  Consultants:  Neurology   Surgery   Procedures:  None    Antibiotics - none  HPI/Subjective: - feels weak, no change from yesterday  Objective: Filed Vitals:   02/09/14 2024 02/10/14 0110 02/10/14 0500 02/10/14 0634  BP: 125/97 125/99  122/76  Pulse: 90 88  85  Temp: 98.8 F (37.1 C) 98.6 F (37 C)  98.5 F (36.9 C)  TempSrc: Oral Oral  Oral  Resp: 16 18    Height:      Weight:   87.952 kg (193 lb 14.4 oz)   SpO2: 98% 100%  98%    Intake/Output Summary (Last 24 hours) at 02/10/14 0859 Last data filed at 02/09/14 2210  Gross per 24 hour  Intake     10 ml  Output   1175 ml  Net  -1165 ml   Filed Weights   02/08/14 1933 02/09/14 0300 02/10/14 0500  Weight: 90.447 kg (199 lb 6.4 oz) 91.1 kg (200 lb 13.4 oz) 87.952  kg (193 lb 14.4 oz)   Exam:  General:  NAD  Cardiovascular: regular rate and rhythm, without MRG  Respiratory: good air movement, clear to auscultation throughout, no wheezing, ronchi or rales  Abdomen: soft, not tender to palpation, positive bowel sounds  MSK: 2+ pitting LE edema  Neuro: 3/5 muscle strength to b/l upper extremities, appears to have flaccid paralysis on b/l lower extremities. Reflexes were absent. No slurred speech or  facial droop  Data Reviewed: Basic Metabolic Panel:  Recent Labs Lab 02/06/14 0353 02/07/14 0502 02/08/14 0440 02/09/14 0750 02/10/14 0445  NA 138 141 138 138 143  K 4.3 4.5 4.4 4.3 4.0  CL 101 104 103 104 106  CO2 22 22 23 23 27   GLUCOSE 75 94 79 168* 156*  BUN 55* 51* 44* 39* 37*  CREATININE 1.40* 1.18 1.08 0.82 0.92  CALCIUM 8.9 9.0 9.2 9.2 9.1  MG  --   --  1.7 1.6 1.5  PHOS  --   --  2.9 2.6  --    Liver Function Tests:  Recent Labs Lab 02/07/14 0502 02/08/14 0440 02/09/14 0750  AST 14 17 16   ALT 11 10 9   ALKPHOS 54 55 59  BILITOT 0.5 0.6 0.5  PROT 6.0 6.0 6.2  ALBUMIN 2.4* 2.6* 2.6*   CBC:  Recent Labs Lab 02/04/14 1809 02/07/14 0502 02/08/14 0440 02/10/14 0445  WBC 9.0 8.3 8.0 8.7  NEUTROABS  --   --  6.1  --   HGB 12.7* 12.2* 12.6* 12.1*  HCT 36.4* 36.0* 36.8* 34.6*  MCV 91.5 93.0 92.5 93.3  PLT 104* 119* 123* 110*   Cardiac Enzymes:  Recent Labs Lab 02/06/14 1807  CKTOTAL 57   BNP (last 3 results)  Recent Labs  01/11/14 1444 01/15/14 0700 02/04/14 1809  PROBNP 9946.0* 2541.0* 5728.0*   Studies: Dg Chest Port 1 View  02/08/2014   CLINICAL DATA:  Confirm line placement  EXAM: PORTABLE CHEST - 1 VIEW  COMPARISON:  DG ABD ACUTE W/CHEST dated 01/11/2014  FINDINGS: A PICC line has been placed via the left upper extremity. The catheter tip lies in the mid portion of the SVC. There is no postprocedure complication. The lungs are reasonably well inflated. There is no focal infiltrate. The cardiac silhouette and pulmonary vascularity are normal. There is calcification in the wall of the aortic arch.  IMPRESSION: No postprocedure complication following placement of the left-sided PICC line.   Electronically Signed   By: David  [place]   On: 02/08/2014 15:05   Scheduled Meds: . furosemide  20 mg Intravenous Q12H  . insulin aspart  0-15 Units Subcutaneous 6 times per day  . sodium chloride  10-40 mL Intracatheter Q12H   Continuous Infusions: .  Marland KitchenTPN (CLINIMIX-E) Adult 40 mL/hr at 02/09/14 1821   And  . fat emulsion 120 mL (02/09/14 1820)   Active Problems:   DYSLIPIDEMIA   Chronic systolic heart failure   Bilateral leg weakness   Bilateral arm weakness   Protein-calorie malnutrition, severe   Neuropathy  Time spent: 35 min, 25 min spent in face to face discussion with patient and wife regarding goals of care.   This note has been created with Education officer, environmental. Any transcriptional errors are unintentional.    02/10/2014, 8:59 AM  LOS: 4 days

## 2014-02-10 NOTE — Progress Notes (Signed)
PARENTERAL NUTRITION CONSULT NOTE - FOLLOW UP  Pharmacy Consult for TPN Indication: Unable to obtain enteral access  Allergies  Allergen Reactions  . Codeine Phosphate     unknown  . Iohexol      Desc: contrast induced renal insufficiency in past, entered 12/25/2004 bsw     . Sulfonamide Derivatives     unknown    Patient Measurements: Height: 6\' 3"  (190.5 cm) Weight: 193 lb 14.4 oz (87.952 kg) IBW/kg (Calculated) : 84.5 Adjusted Body Weight:  Usual Weight:   Vital Signs: Temp: 98.5 F (36.9 C) (05/24 0634) Temp src: Oral (05/24 0634) BP: 122/76 mmHg (05/24 0634) Pulse Rate: 85 (05/24 0634) Intake/Output from previous day: 05/23 0701 - 05/24 0700 In: 10 [I.V.:10] Out: 1175 [Urine:1175] Intake/Output from this shift:    Labs:  Recent Labs  02/08/14 0440 02/10/14 0445  WBC 8.0 8.7  HGB 12.6* 12.1*  HCT 36.8* 34.6*  PLT 123* 110*     Recent Labs  02/08/14 0440 02/09/14 0750 02/10/14 0445  NA 138 138 143  K 4.4 4.3 4.0  CL 103 104 106  CO2 23 23 27   GLUCOSE 79 168* 156*  BUN 44* 39* 37*  CREATININE 1.08 0.82 0.92  CALCIUM 9.2 9.2 9.1  MG 1.7 1.6 1.5  PHOS 2.9 2.6  --   PROT 6.0 6.2  --   ALBUMIN 2.6* 2.6*  --   AST 17 16  --   ALT 10 9  --   ALKPHOS 55 59  --   BILITOT 0.6 0.5  --   PREALBUMIN 10.6*  --   --   TRIG 114  --   --    Estimated Creatinine Clearance: 70.2 ml/min (by C-G formula based on Cr of 0.92).    Recent Labs  02/10/14 0009 02/10/14 0424 02/10/14 0827  GLUCAP 117* 141* 143*    Medications:  Scheduled:  . furosemide  20 mg Intravenous Q12H  . insulin aspart  0-15 Units Subcutaneous 6 times per day  . sodium chloride  10-40 mL Intracatheter Q12H    Insulin Requirements in the past 24 hours:  4 units of mod SSI since TPN initiated, no insulin in TPN   Current Nutrition:  Clinimix-E 5/20 at 9140ml/hr, goal rate of 3990ml/hr + 20% fat emulsion at 3110ml/hr to provide:  108g/day protein, 2380Kcal/day.   Nutritional  Goals:  2300-2500 kCal, 115-130 grams of protein per day (estimated from TF rec per RD on 5/21)   Assessment:  80yo with progressive neuromuscular disorder, unable to walk x 4y; awaiting bicep muscle bx results. Recently hospitalized for diarrhea and discharged to SNF ~3wks ago, where unable to initiate swallowing fxn on a MBS study. Pt has consumed no solid foods x 1 month and limited liquids.   GI: GERD. NPO; could not obtain enteral access- to try for G-tube by IR on 5/26.   Endo: DM. CBG 117-156 on minimum rate of TPN   Lytes: K 4, Mg 1.5- stable. Pt is at risk for re-feeding syndrome   Renal: CRI. Cr < 1 (1.4 on 5/20). UOP 0.506ml/kg/hr   Pulm: RA   Cards: CHF, HTN. 112/58, 74. Lasix IV q12   Hepatobil: LFTs wnl   Neuro: Progressive neuromuscular d/o, awaiting muscle bx results.  Few options  ID: AFeb, WBC wnl, on no abx   Best Practices:  TPN Access: PICC-double lumen (5/22)  TPN day#: 5/23 >>  Plan:  1- Increase Clinimix-E 5/20 to 5760ml/hr with Lipids 10 ml/hr 2-  Insulin 8 units per bag and continue mod SSI q4  3- TPN labs Mon, Thurs  4- F/U baseline PAlb  Marisue Humble, PharmD Clinical Pharmacist [facility]

## 2014-02-11 ENCOUNTER — Encounter (HOSPITAL_COMMUNITY): Payer: Self-pay | Admitting: Radiology

## 2014-02-11 DIAGNOSIS — Z515 Encounter for palliative care: Secondary | ICD-10-CM

## 2014-02-11 LAB — DIFFERENTIAL
Basophils Absolute: 0 K/uL (ref 0.0–0.1)
Basophils Relative: 0 % (ref 0–1)
Eosinophils Absolute: 0.2 K/uL (ref 0.0–0.7)
Eosinophils Relative: 2 % (ref 0–5)
Lymphocytes Relative: 10 % — ABNORMAL LOW (ref 12–46)
Lymphs Abs: 1 K/uL (ref 0.7–4.0)
Monocytes Absolute: 0.8 K/uL (ref 0.1–1.0)
Monocytes Relative: 8 % (ref 3–12)
Neutro Abs: 7.6 K/uL (ref 1.7–7.7)
Neutrophils Relative %: 80 % — ABNORMAL HIGH (ref 43–77)

## 2014-02-11 LAB — COMPREHENSIVE METABOLIC PANEL
ALK PHOS: 56 U/L (ref 39–117)
ALT: 10 U/L (ref 0–53)
AST: 17 U/L (ref 0–37)
Albumin: 2.4 g/dL — ABNORMAL LOW (ref 3.5–5.2)
BILIRUBIN TOTAL: 0.5 mg/dL (ref 0.3–1.2)
BUN: 39 mg/dL — ABNORMAL HIGH (ref 6–23)
CHLORIDE: 105 meq/L (ref 96–112)
CO2: 25 meq/L (ref 19–32)
Calcium: 8.9 mg/dL (ref 8.4–10.5)
Creatinine, Ser: 0.89 mg/dL (ref 0.50–1.35)
GFR calc non Af Amer: 76 mL/min — ABNORMAL LOW (ref >90)
GFR, EST AFRICAN AMERICAN: 88 mL/min — AB (ref >90)
Glucose, Bld: 168 mg/dL — ABNORMAL HIGH (ref 70–99)
Potassium: 3.8 mEq/L (ref 3.7–5.3)
SODIUM: 140 meq/L (ref 137–147)
Total Protein: 5.9 g/dL — ABNORMAL LOW (ref 6.0–8.3)

## 2014-02-11 LAB — CBC
HCT: 34.2 % — ABNORMAL LOW (ref 39.0–52.0)
Hemoglobin: 11.9 g/dL — ABNORMAL LOW (ref 13.0–17.0)
MCH: 32.9 pg (ref 26.0–34.0)
MCHC: 34.8 g/dL (ref 30.0–36.0)
MCV: 94.5 fL (ref 78.0–100.0)
Platelets: 97 K/uL — ABNORMAL LOW (ref 150–400)
RBC: 3.62 MIL/uL — ABNORMAL LOW (ref 4.22–5.81)
RDW: 14.2 % (ref 11.5–15.5)
WBC: 9.5 K/uL (ref 4.0–10.5)

## 2014-02-11 LAB — GLUCOSE, CAPILLARY
Glucose-Capillary: 117 mg/dL — ABNORMAL HIGH (ref 70–99)
Glucose-Capillary: 149 mg/dL — ABNORMAL HIGH (ref 70–99)
Glucose-Capillary: 157 mg/dL — ABNORMAL HIGH (ref 70–99)
Glucose-Capillary: 166 mg/dL — ABNORMAL HIGH (ref 70–99)
Glucose-Capillary: 186 mg/dL — ABNORMAL HIGH (ref 70–99)

## 2014-02-11 LAB — MAGNESIUM: Magnesium: 1.4 mg/dL — ABNORMAL LOW (ref 1.5–2.5)

## 2014-02-11 LAB — PREALBUMIN: Prealbumin: 9 mg/dL — ABNORMAL LOW (ref 17.0–34.0)

## 2014-02-11 LAB — TRIGLYCERIDES: Triglycerides: 72 mg/dL (ref <150)

## 2014-02-11 LAB — PHOSPHORUS: Phosphorus: 3.3 mg/dL (ref 2.3–4.6)

## 2014-02-11 MED ORDER — FAT EMULSION 20 % IV EMUL
240.0000 mL | INTRAVENOUS | Status: AC
  Administered 2014-02-11: 240 mL via INTRAVENOUS
  Filled 2014-02-11: qty 250

## 2014-02-11 MED ORDER — LORAZEPAM 2 MG/ML IJ SOLN: 0.5000 mg | INTRAMUSCULAR | Status: DC | PRN

## 2014-02-11 MED ORDER — TRACE MINERALS CR-CU-F-FE-I-MN-MO-SE-ZN IV SOLN
INTRAVENOUS | Status: AC
  Administered 2014-02-11: 17:00:00 via INTRAVENOUS
  Filled 2014-02-11: qty 2000

## 2014-02-11 NOTE — Progress Notes (Signed)
PROGRESS NOTE  Ethan Mclean VKP:[id number] DOB: [birth year] DOA: 02/06/2014 PCP: [name], MD  HPI: Ethan Mclean is a 80 y.o. male has a past medical history significant for systolic heart failure (EF 25-30% on 4/26), HTN, DM, admitted after had a nerve conduction study in the [facility] office. He was discharged from the hospital service approximately 3 weeks ago after a hospitalization for diarrhea, went to a SNF where he was found to have inability to initiate swallowing function based on a MBS study. He was supposed to see neurology as an outpatient however was unable to get there. As previously described in Dr. Chancy Milroy H&P, Mr. Lorona had progressively worsening loss of motor function of his lower extremities to the point now where he could barely move his feet. This has not affected his sensation. He also noticed progressively worsening decline in upper extremity function, but still has some strength and loss of fine motor control and decreasing gross motor control.  Assessment/Plan: Severe primary axonal peripheral neuropathy -  -Case was discussed with Neurology -It appears he has had progressive weakness for some time now, nonambulatory for the last 4 years.  -Underwent muscle biopsy, results are pending -Unfortunately limited treatment options.   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   Severe protein-calorie malnutrition  -TPN started -Re-consult IR for placement of feeding tube   Systolic heart failure  -Compensated  Thrombocytopenia - likely in the setting of severe malnutrition, improving  Goals of care: Extensive discussion held on 02/10/2014 regarding goals of care with patient and his wife who was present at bedside. I explained results of nerve conduction test and diagnosis of severe primary axonal neuropathy and myositis. I conveyed to them that treatment options where limited and that neurology could not offer pharmacologic  intervention that would reverse underlying progressive neurologic disorder. He expressed desire to focus his care on comfort and quality of life rather than undergoing further invasive/burdensome procedures that would not change endpoint. As for feeding he wishes to proceed with PEG tube placement. IR consult made for PEG placement in am.   Overall he would like to go home with home hospice services and focus on quality of life. He is interested to speaking with hospice and palliative care further. Palliative care consult placed. SW consult for setting up home hospice.      IR consulted for PEG placement which is planned for Tues 02/12/14  Diet: NPO Fluids: none DVT Prophylaxis: SCD  Code Status: DNR Family Communication: wife at bedside  Disposition Plan: Discharge home with home hospice  Consultants:  Neurology   Surgery   Procedures:  None    Antibiotics - none  HPI/Subjective: - feels weak, no change from yesterday  Objective: Filed Vitals:   02/10/14 2153 02/11/14 0218 02/11/14 0421 02/11/14 0500  BP: 128/93 123/72  127/84  Pulse: 60 85  88  Temp: 96.6 F (35.9 C) 98.9 F (37.2 C)    TempSrc: Oral Oral  Oral  Resp: 20 16  16   Height:      Weight:   87.25 kg (192 lb 5.6 oz) 85.049 kg (187 lb 8 oz)  SpO2: 98% 98%  98%    Intake/Output Summary (Last 24 hours) at 02/11/14 0827 Last data filed at 02/11/14 0600  Gross per 24 hour  Intake     10 ml  Output   3350 ml  Net  -3340 ml   Filed Weights   02/10/14 0500 02/11/14 0421 02/11/14 0500  Weight: 87.952 kg (193 lb 14.4 oz) 87.25 kg (192 lb 5.6 oz) 85.049 kg (187 lb 8 oz)   Exam:  General:  NAD  Cardiovascular: regular rate and rhythm, without MRG  Respiratory: good air movement, clear to auscultation throughout, no wheezing, ronchi or rales  Abdomen: soft, not tender to palpation, positive bowel sounds  MSK: 2+ pitting LE edema  Neuro: 3/5 muscle strength to b/l upper extremities, appears to have  flaccid paralysis on b/l lower extremities. Reflexes were absent. No slurred speech or facial droop  Data Reviewed: Basic Metabolic Panel:  Recent Labs Lab 02/07/14 0502 02/08/14 0440 02/09/14 0750 02/10/14 0445 02/11/14 0455  NA 141 138 138 143 140  K 4.5 4.4 4.3 4.0 3.8  CL 104 103 104 106 105  CO2 22 23 23 27 25   GLUCOSE 94 79 168* 156* 168*  BUN 51* 44* 39* 37* 39*  CREATININE 1.18 1.08 0.82 0.92 0.89  CALCIUM 9.0 9.2 9.2 9.1 8.9  MG  --  1.7 1.6 1.5 1.4*  PHOS  --  2.9 2.6  --  3.3   Liver Function Tests:  Recent Labs Lab 02/07/14 0502 02/08/14 0440 02/09/14 0750 02/11/14 0455  AST 14 17 16 17   ALT 11 10 9 10   ALKPHOS 54 55 59 56  BILITOT 0.5 0.6 0.5 0.5  PROT 6.0 6.0 6.2 5.9*  ALBUMIN 2.4* 2.6* 2.6* 2.4*   CBC:  Recent Labs Lab 02/04/14 1809 02/07/14 0502 02/08/14 0440 02/10/14 0445 02/11/14 0455  WBC 9.0 8.3 8.0 8.7 9.5  NEUTROABS  --   --  6.1  --  7.6  HGB 12.7* 12.2* 12.6* 12.1* 11.9*  HCT 36.4* 36.0* 36.8* 34.6* 34.2*  MCV 91.5 93.0 92.5 93.3 94.5  PLT 104* 119* 123* 110* 97*   Cardiac Enzymes:  Recent Labs Lab 02/06/14 1807  CKTOTAL 57   BNP (last 3 results)  Recent Labs  01/11/14 1444 01/15/14 0700 02/04/14 1809  PROBNP 9946.0* 2541.0* 5728.0*   Studies: No results found. Scheduled Meds: . furosemide  20 mg Intravenous Q12H  . insulin aspart  0-15 Units Subcutaneous 6 times per day  . sodium chloride  10-40 mL Intracatheter Q12H   Continuous Infusions: . Marland Kitchen.TPN (CLINIMIX-E) Adult 60 mL/hr at 02/10/14 1809   And  . fat emulsion 240 mL (02/10/14 1810)   Active Problems:   DYSLIPIDEMIA   Chronic systolic heart failure   Bilateral leg weakness   Bilateral arm weakness   Protein-calorie malnutrition, severe   Neuropathy  Time spent: 35 min, 25 min spent in face to face discussion with patient and wife regarding goals of care.   This note has been created with Personnel officerDragon speech recognition software and smart phrase  technology. Any transcriptional errors are unintentional.    02/11/2014, 8:27 AM  LOS: 5 days

## 2014-02-11 NOTE — Consult Note (Signed)
Reason for Consult:Dysphagia, Gastrostomy feeding tube requested Consulting Radiologist: Hassell/Henn Referring Physician: Coralyn Pear   HPI: Ethan Mclean is an 80 y.o. male with progression axonal peripheral neuropathy as well as progressive dysphagia. He did not do well well with Bedside swallow eval and was determined high risk for aspiration. He has been started on TPN but percutaneous gastrostomy tube has been suggested and ordered. Pt also to meet with Palliative care team for [place] meeting later today. Pt states hasn't made final decision if he wants G-tube yet. IR eval to explain procedure and expectations with gastrostomy feeding tube. PMHx, chart, meds, labs reviewed.  Past Medical History:  Past Medical History  Diagnosis Date  . CHF (congestive heart failure)   . DVT (deep venous thrombosis)     "legs"  . Complication of anesthesia     "slow to wake up after eye OR"  . Type II diabetes mellitus     "dx'd years ago; don't have it anymroe" (01/11/2014)  . GERD (gastroesophageal reflux disease)   . Gout   . H/O echocardiogram     nonischemi cardiomyopathy EF 40-45%   . Asymptomatic PVCs   . Hypertension   . Statin intolerance   . Balance problems   . Chronic renal insufficiency   . Carpal tunnel syndrome on right   . Gout   . Osteoarthritis   . H/O CHF W/ LE Edema  . Hx of adenomatous colonic polyps   . Chronic edema     LE from chronic  DVTS    Surgical History:  Past Surgical History  Procedure Laterality Date  . Appendectomy    . Vena cava filter placement    . Cataract extraction w/ intraocular lens implant Left   . Tonsillectomy    . Eye surgery Bilateral     "to retain tears"  . Cardiac catheterization  12/2001    Normal coronary arteries by cath 2003    Family History:  Family History  Problem Relation Age of Onset  . Cancer Other     Social History:  reports that he quit smoking about 62 years ago. His smoking use included Cigarettes. He smoked 0.00  packs per day for 5 years. He has never used smokeless tobacco. He reports that he drinks alcohol. He reports that he does not use illicit drugs.  Allergies:  Allergies  Allergen Reactions  . Codeine Phosphate     unknown  . Iohexol      Desc: contrast induced renal insufficiency in past, entered 12/25/2004 bsw     . Sulfonamide Derivatives     unknown    Medications:Current facility-administered medications:albuterol (PROVENTIL) (2.5 MG/3ML) 0.083% nebulizer solution 2.5 mg, 2.5 mg, Nebulization, Q6H PRN, Costin Karlyne Greenspan, MD;  fat emulsion 20 % infusion 240 mL, 240 mL, Intravenous, Continuous TPN, Kendra P Hiatt, RPH, Last Rate: 10 mL/hr at 02/10/14 1810, 240 mL at 02/10/14 1810;  fat emulsion 20 % infusion 240 mL, 240 mL, Intravenous, Continuous TPN, Kendra P Hiatt, RPH furosemide (LASIX) injection 20 mg, 20 mg, Intravenous, Q12H, Caren Griffins, MD, 20 mg at 02/11/14 0520;  insulin aspart (novoLOG) injection 0-15 Units, 0-15 Units, Subcutaneous, 6 times per day, Caren Griffins, MD, 3 Units at 02/11/14 0926;  sodium chloride 0.9 % injection 10-40 mL, 10-40 mL, Intracatheter, Q12H, Caren Griffins, MD, 10 mL at 02/10/14 2244 sodium chloride 0.9 % injection 10-40 mL, 10-40 mL, Intracatheter, PRN, Caren Griffins, MD, 10 mL at 02/11/14 0453;  TPN (CLINIMIX-E)  Adult, , Intravenous, Continuous TPN, Jaquita Folds, RPH, Last Rate: 60 mL/hr at 02/10/14 1809;  TPN (CLINIMIX-E) Adult, , Intravenous, Continuous TPN, Kendra P Hiatt, RPH  ROS: See HPI for pertinent findings, otherwise complete 10 system review negative.  Physical Exam: Blood pressure 127/84, pulse 88, temperature 98.9 F (37.2 C), temperature source Oral, resp. rate 16, height _0  (1.905 m), weight 187 lb 8 oz (85.049 kg), SpO2 98.00%. ENT: unremarkable airway Lungs: CTA, breathing unlabored Heart: Reg rate and rhthym Abd: soft, NT, ND   Labs: CBC  Recent Labs  02/10/14 0445 02/11/14 0455  WBC 8.7 9.5  HGB 12.1*  11.9*  HCT 34.6* 34.2*  PLT 110* 97*   MET  Recent Labs  02/10/14 0445 02/11/14 0455  NA 143 140  K 4.0 3.8  CL 106 105  CO2 27 25  GLUCOSE 156* 168*  BUN 37* 39*  CREATININE 0.92 0.89  CALCIUM 9.1 8.9    Recent Labs  02/11/14 0455  PROT 5.9*  ALBUMIN 2.4*  AST 17  ALT 10  ALKPHOS 56  BILITOT 0.5   PT/INR No results found for this basename: LABPROT, INR,  in the last 72 hours ABG No results found for this basename: PHART, PCO2, PO2, HCO3,  in the last 72 hours    Assessment/Plan: Progressive dysphagia secondary to Axonal neuropathy Failed Swallow study. Explained IR technique for placement of percutaneous G-tube. Explained risks, complications, use of sedation, as well as post procedure expectations, care, long term issues, and that G-tube can be removed(after minimum 6 weeks) if not desired anymore or swallow function improves. Labs reviewed. Pt and family to meet with Palliative team today and he will make final decision. If he wished to proceed, RN will have pt/family sign consent Procedure tentative for tomorrow.  Ascencion Dike PA-C 02/11/2014, 9:32 AM

## 2014-02-11 NOTE — Progress Notes (Signed)
Full note to follow:  Ethan Mclean has decided against feeding tube and to focus on comfort measures due to progressive worsening of his neurological condition. He and his family agree at this time that his care would be difficult at home and given his poor prognosis (especially with no feeding tube and unable to swallow to take in any nutrition) of ~2 weeks or less they would like to consider hospice facility. He had not eaten in ~2 weeks prior to hospital admission. His main concern is his comfort and that he does not suffer. They understand that hospice facility will not be doing curative treatment such as IVs, fluids, antibiotics, lab draws and diagnostic testing. I will continue to follow and support.  Yong Channel, NP Palliative Medicine Team Pager # [phone] (M-F 8a-5p) Team Phone # [phone] (Nights/Weekends)

## 2014-02-11 NOTE — Consult Note (Signed)
Patient GU:YQIHK Ethan Mclean      DOB: [birth year]      VQQ:[id number]     Consult Note from the Palliative Medicine Team at [place] Requested by: Dr. Coralyn Pear     PCP: Abigail Miyamoto, MD Reason for Consultation: [place] and options.    Phone [phone]  Assessment of patients Current state: Ethan Mclean is a 80 yo male with severe primary axonal peripheral neuropathy which family says they have been told is attributed to past statin usage. This has progressed up his body over the past few years with increasing weakness that is irreversible. He is now unable to swallow anything at all and is likely very painful for him to try and swallow anything according to [place], SLP.   I met today with Ethan Mclean, wife Barnetta Chapel of 14 years, son Barnabas Lister and his wife Deb Investment banker, corporate), and granddaughter Geni Bers (just graduated RN school and has been helping them at home). [name] has decided against feeding tube and to focus on comfort measures due to progressive worsening of his neurological condition. He and his family agree at this time that his care would be difficult at home and given his poor prognosis (especially with no feeding tube and unable to swallow to take in any nutrition) of <2 weeks they would like to transfer to hospice facility. He had not eaten in ~2 weeks prior to hospital admission. His main concern is his comfort and that he does not suffer. He has a lot of concerns regarding his bed if he is able to go to [facility] and is concerned with the comfort with the bed and being able to accommodate his height. I have made some calls and have answered his questions regarding this concern. They understand that hospice facility will not be doing curative treatment such as IVs, fluids, antibiotics, lab draws and diagnostic testing. However, he wishes to continue TPN until he is able to transfer but understands this will not be continued at hospice. His wife is very upset as she was hoping to  take him home but they have decided as a family that they cannot give him the care and support he needs at home at this point in his disease process. Their hopes now are to get him somewhere where he is able to get the compassionate and good care that he needs and deserves. Ethan Mclean says there is nothing else concerning him at this time. I will continue to follow and support Ethan Mclean and his family through this difficult situation.     Goals of Care: 1.  Code Status: DNR   2. Scope of Treatment: Focus on comfort. Continue TPN until transfer to hospice facility.    4. Disposition: Hopeful for hospice facility.    3. Symptom Management:   1. Anxiety/Agitation: Lorazepam prn.  2. Dysphagia: Able to have ice chips and drops of fluids as desired. We also discussed having some bits of ice cream and bites of food he can chew and spit out if he desires. 3. Weakness: Continue medical management for now. Focus on comfort.   4. Psychosocial: Emotional support provided to patient and family.    Patient Documents Completed or Given: Document Given Completed  Advanced Directives Pkt    MOST    DNR    Gone from My Sight    Hard Choices yes     Brief HPI: 80 yo male with progressive neurological disease now affecting his swallowing abilities. He  is opting for comfort and no feeding tube.   ROS: Denies pain, nausea, constipation. + anxiety    PMH:  Past Medical History  Diagnosis Date  . CHF (congestive heart failure)   . DVT (deep venous thrombosis)     "legs"  . Complication of anesthesia     "slow to wake up after eye OR"  . Type II diabetes mellitus     "dx'd years ago; don't have it anymroe" (01/11/2014)  . GERD (gastroesophageal reflux disease)   . Gout   . H/O echocardiogram     nonischemi cardiomyopathy EF 40-45%   . Asymptomatic PVCs   . Hypertension   . Statin intolerance   . Balance problems   . Chronic renal insufficiency   . Carpal tunnel syndrome on right    . Gout   . Osteoarthritis   . H/O CHF W/ LE Edema  . Hx of adenomatous colonic polyps   . Chronic edema     LE from chronic  DVTS     PSH: Past Surgical History  Procedure Laterality Date  . Appendectomy    . Vena cava filter placement    . Cataract extraction w/ intraocular lens implant Left   . Tonsillectomy    . Eye surgery Bilateral     "to retain tears"  . Cardiac catheterization  12/2001    Normal coronary arteries by cath 2003   I have reviewed the [place] and SH and  If appropriate update it with new information. Allergies  Allergen Reactions  . Codeine Phosphate     unknown  . Iohexol      Desc: contrast induced renal insufficiency in past, entered 12/25/2004 bsw     . Sulfonamide Derivatives     unknown   Scheduled Meds: . furosemide  20 mg Intravenous Q12H  . insulin aspart  0-15 Units Subcutaneous 6 times per day  . sodium chloride  10-40 mL Intracatheter Q12H   Continuous Infusions: . Marland KitchenTPN (CLINIMIX-E) Adult 60 mL/hr at 02/10/14 1809   And  . fat emulsion 240 mL (02/10/14 1810)  . Marland KitchenTPN (CLINIMIX-E) Adult     And  . fat emulsion     PRN Meds:.albuterol, sodium chloride    BP 136/83  Pulse 88  Temp(Src) 100.5 F (38.1 C) (Oral)  Resp 16  Ht _0  (1.905 m)  Wt 85.049 kg (187 lb 8 oz)  BMI 23.44 kg/m2  SpO2 98%   PPS: 20%   Intake/Output Summary (Last 24 hours) at 02/11/14 1323 Last data filed at 02/11/14 1220  Gross per 24 hour  Intake     10 ml  Output   3350 ml  Net  -3340 ml   LBM: 02/11/14                         Physical Exam:  General: NAD, frail, thin, chronically ill appearing HEENT: + severe temporal muscle wasting, no JVD, mucous membranes dry without exudate Chest: CTA throughout, no labored breathing, symmetric CVS: RRR, S1 S2 Abdomen: Soft, NT, ND, hypoactive BS Ext: BLE 2+ edema, tender to movement/touch at times, warm to touch Neuro: Alert, oriented x 3  Labs: CBC    Component Value Date/Time   WBC 9.5 02/11/2014  0455   RBC 3.62* 02/11/2014 0455   HGB 11.9* 02/11/2014 0455   HCT 34.2* 02/11/2014 0455   PLT 97* 02/11/2014 0455   MCV 94.5 02/11/2014 0455   MCH 32.9 02/11/2014 0455  MCHC 34.8 02/11/2014 0455   RDW 14.2 02/11/2014 0455   LYMPHSABS 1.0 02/11/2014 0455   MONOABS 0.8 02/11/2014 0455   EOSABS 0.2 02/11/2014 0455   BASOSABS 0.0 02/11/2014 0455    BMET    Component Value Date/Time   NA 140 02/11/2014 0455   K 3.8 02/11/2014 0455   CL 105 02/11/2014 0455   CO2 25 02/11/2014 0455   GLUCOSE 168* 02/11/2014 0455   BUN 39* 02/11/2014 0455   CREATININE 0.89 02/11/2014 0455   CALCIUM 8.9 02/11/2014 0455   GFRNONAA 76* 02/11/2014 0455   GFRAA 88* 02/11/2014 0455    CMP     Component Value Date/Time   NA 140 02/11/2014 0455   K 3.8 02/11/2014 0455   CL 105 02/11/2014 0455   CO2 25 02/11/2014 0455   GLUCOSE 168* 02/11/2014 0455   BUN 39* 02/11/2014 0455   CREATININE 0.89 02/11/2014 0455   CALCIUM 8.9 02/11/2014 0455   PROT 5.9* 02/11/2014 0455   ALBUMIN 2.4* 02/11/2014 0455   AST 17 02/11/2014 0455   ALT 10 02/11/2014 0455   ALKPHOS 56 02/11/2014 0455   BILITOT 0.5 02/11/2014 0455   GFRNONAA 76* 02/11/2014 0455   GFRAA 88* 02/11/2014 0455      Time In Time Out Total Time Spent with Patient Total Overall Time  1100 1230 34mn 959m    Greater than 50%  of this time was spent counseling and coordinating care related to the above assessment and plan.  AlVinie SillNP Palliative Medicine Team Pager # [phone]-F 8a-5p) Team Phone # [phone]/Weekends)

## 2014-02-11 NOTE — Progress Notes (Addendum)
PARENTERAL NUTRITION CONSULT NOTE - FOLLOW UP  Pharmacy Consult for TPN Indication:  Unable to obtain enteral access  Allergies  Allergen Reactions  . Codeine Phosphate     unknown  . Iohexol      Desc: contrast induced renal insufficiency in past, entered 12/25/2004 bsw     . Sulfonamide Derivatives     unknown    Patient Measurements: Height: 6\' 3"  (190.5 cm) Weight: 187 lb 8 oz (85.049 kg) IBW/kg (Calculated) : 84.5 Adjusted Body Weight:  Usual Weight:   Vital Signs: Temp: 98.9 F (37.2 C) (05/25 0218) Temp src: Oral (05/25 0500) BP: 127/84 mmHg (05/25 0500) Pulse Rate: 88 (05/25 0500) Intake/Output from previous day: 05/24 0701 - 05/25 0700 In: 10 [I.V.:10] Out: 3350 [Urine:3350] Intake/Output from this shift:    Labs:  Recent Labs  02/10/14 0445 02/11/14 0455  WBC 8.7 9.5  HGB 12.1* 11.9*  HCT 34.6* 34.2*  PLT 110* 97*     Recent Labs  02/09/14 0750 02/10/14 0445 02/11/14 0455  NA 138 143 140  K 4.3 4.0 3.8  CL 104 106 105  CO2 23 27 25   GLUCOSE 168* 156* 168*  BUN 39* 37* 39*  CREATININE 0.82 0.92 0.89  CALCIUM 9.2 9.1 8.9  MG 1.6 1.5 1.4*  PHOS 2.6  --  3.3  PROT 6.2  --  5.9*  ALBUMIN 2.6*  --  2.4*  AST 16  --  17  ALT 9  --  10  ALKPHOS 59  --  56  BILITOT 0.5  --  0.5  TRIG  --   --  72   Estimated Creatinine Clearance: 72.5 ml/min (by C-G formula based on Cr of 0.89).    Recent Labs  02/10/14 2353 02/11/14 0355 02/11/14 0816  GLUCAP 166* 157* 186*    Medications:  Scheduled:  . furosemide  20 mg Intravenous Q12H  . insulin aspart  0-15 Units Subcutaneous 6 times per day  . sodium chloride  10-40 mL Intracatheter Q12H    Insulin Requirements in the past 24 hours:  8 units of mod SSI since TPN initiated, 8 units/bag in TPN   Current Nutrition:  Clinimix-E 5/20 at 7040ml/hr, goal rate of 1590ml/hr + 20% fat emulsion at 510ml/hr to provide:  108g/day protein, 2380Kcal/day.   Nutritional Goals:  2300-2500 kCal, 115-130  grams of protein per day (estimated from TF rec per RD on 5/21)   Assessment:  80yo with progressive neuromuscular disorder, unable to walk x 4y; awaiting bicep muscle bx results. Recently hospitalized for diarrhea and discharged to SNF ~3wks ago, where unable to initiate swallowing fxn on a MBS study. Pt has consumed no solid foods x 1 month and limited liquids.  GI: GERD. NPO; could not obtain enteral access- to try for PEG by IR on 5/26.  Pt was on PPI pta.  Endo: DM. CBG 146-186 on minimum rate of TPN   Lytes: K 3.8- drifting down, Mg 1.4-also drifting, Phos 3.3. Pt is at risk for re-feeding syndrome   Renal: CRI. Cr < 1 (Calc CrCl 6465ml/min), UOP 1.266ml/kg/hr   Pulm: RA   Cards: CHF, HTN. VSS. Lasix IV q12   Hepatobil: LFTs wnl.  TG 72, Palb 10.6 indicating nutritional deficiency as baseline  Neuro: Progressive neuromuscular d/o, awaiting muscle bx results. Few tx options   ID: AFeb, WBC wnl, on no abx   Best Practices:  TPN Access: PICC-double lumen (5/22)  TPN day#: 5/23 >>   Plan:  1-  Continue Clinimix-E 5/20 to 38ml/hr with Lipids 10 ml/hr  2- Increase Insulin to 15 units per bag and continue mod SSI q4  3- BMet, Mg in AM 4- Add Pepcid 40mg  to TPN while NPO, if OK with MD  Marisue Humble, PharmD Clinical Pharmacist [facility]

## 2014-02-11 NOTE — Progress Notes (Signed)
SLP Cancellation Note  Patient Details Name: Ethan Mclean MRN: [id number] DOB: 03/30/1926   Cancelled treatment:       Reason Eval/Treat Not Completed:  (slp spoke to palliative NP who reports plan is now for comfort care - pt does not desire PEG - dysphagia is gross and intake  is likely not comforting for this pt)  Agree with possible recommend for pt to masticate and expectorate solids and use toothette with flavored drinks if provide pt with comfort.  Pt has been consuming melted ice per palliative NP. SLP to sign off, please reorder if desire.    Thanks.    Donavan Burnet, MS [facility] SLP [phone]

## 2014-02-12 ENCOUNTER — Encounter (HOSPITAL_COMMUNITY): Payer: Self-pay | Admitting: General Surgery

## 2014-02-12 DIAGNOSIS — R609 Edema, unspecified: Secondary | ICD-10-CM

## 2014-02-12 DIAGNOSIS — Z515 Encounter for palliative care: Secondary | ICD-10-CM

## 2014-02-12 LAB — GLUCOSE, CAPILLARY
GLUCOSE-CAPILLARY: 123 mg/dL — AB (ref 70–99)
GLUCOSE-CAPILLARY: 126 mg/dL — AB (ref 70–99)
GLUCOSE-CAPILLARY: 141 mg/dL — AB (ref 70–99)
Glucose-Capillary: 144 mg/dL — ABNORMAL HIGH (ref 70–99)
Glucose-Capillary: 132 mg/dL — ABNORMAL HIGH (ref 70–99)
Glucose-Capillary: 140 mg/dL — ABNORMAL HIGH (ref 70–99)

## 2014-02-12 LAB — BASIC METABOLIC PANEL
BUN: 45 mg/dL — AB (ref 6–23)
CO2: 28 mEq/L (ref 19–32)
CREATININE: 0.92 mg/dL (ref 0.50–1.35)
Calcium: 8.9 mg/dL (ref 8.4–10.5)
Chloride: 105 mEq/L (ref 96–112)
GFR calc non Af Amer: 75 mL/min — ABNORMAL LOW (ref >90)
GFR, EST AFRICAN AMERICAN: 87 mL/min — AB (ref >90)
GLUCOSE: 128 mg/dL — AB (ref 70–99)
POTASSIUM: 3.8 meq/L (ref 3.7–5.3)
Sodium: 143 mEq/L (ref 137–147)

## 2014-02-12 LAB — LUPUS ANTICOAGULANT PANEL
DRVVT INCUBATED 1 1 MIX: 37.9 s (ref <42.9)
DRVVT: 45.1 s — AB (ref <42.9)
LUPUS ANTICOAGULANT: NOT DETECTED
PTT Lupus Anticoagulant: 43.2 secs — ABNORMAL HIGH (ref 28.0–43.0)
PTTLA 4:1 Mix: 37.9 secs (ref 28.0–43.0)

## 2014-02-12 LAB — PROTIME-INR
INR: 1.49 (ref 0.00–1.49)
Prothrombin Time: 17.6 seconds — ABNORMAL HIGH (ref 11.6–15.2)

## 2014-02-12 LAB — MAGNESIUM: Magnesium: 1.4 mg/dL — ABNORMAL LOW (ref 1.5–2.5)

## 2014-02-12 MED ORDER — MORPHINE SULFATE (CONCENTRATE) 10 MG /0.5 ML PO SOLN: 5.0000 mg | ORAL | Status: DC | PRN

## 2014-02-12 MED ORDER — MAGNESIUM SULFATE 40 MG/ML IJ SOLN
2.0000 g | Freq: Once | INTRAMUSCULAR | Status: AC
  Administered 2014-02-12: 2 g via INTRAVENOUS
  Filled 2014-02-12: qty 50

## 2014-02-12 MED ORDER — FAT EMULSION 20 % IV EMUL
240.0000 mL | INTRAVENOUS | Status: DC
  Administered 2014-02-12: 240 mL via INTRAVENOUS
  Filled 2014-02-12: qty 250

## 2014-02-12 MED ORDER — M.V.I. ADULT IV INJ
INJECTION | INTRAVENOUS | Status: DC
  Administered 2014-02-12: 18:00:00 via INTRAVENOUS
  Filled 2014-02-12: qty 2000

## 2014-02-12 MED ORDER — BISACODYL 10 MG RE SUPP: 10.0000 mg | Freq: Every day | RECTAL | Status: DC | PRN

## 2014-02-12 NOTE — Clinical Social Work Note (Signed)
CSW received consult for residential hospice placement. CSW spoke with pt's wife and granddaughter regarding discharge disposition. Pt's wife requested CSW speak with pt's granddaughter. Per pt's granddaughter, family has chosen residential hospice placement for pt's discharge disposition. Family requested [facility] residential hospice placement. CSW has contacted [facility] admissions liaison to complete referral. CSW offered support to pt's wife and granddaughter. CSW to continue to follow and assist with discharge planning needs.  Darlyn Chamber, LCSWA Clinical Social Worker [phone]

## 2014-02-12 NOTE — Progress Notes (Signed)
Progress Note from the Palliative Medicine Team at [facility]  Subjective: I spoke with [name] today and they are prepared to go [facility]. They are anxious to get out of the hospital. They have concerns about timeliness that he has been attended too (say it takes 45 minutes or longer for him to be cleaned up from stool) - I have passed this concerns to his RN and charge RN today. I also relayed information to them regarding the actual bed at [facility] as this was a great concern to them. They tell me that they wish to continue TPN while in the hospital and I clarify that this will not be continued at hospice. They verbalize understanding. I asked them to please call me with any further questions or concerns. I will continue to follow and support.     Objective: Allergies  Allergen Reactions  . Codeine Phosphate     unknown  . Iohexol      Desc: contrast induced renal insufficiency in past, entered 12/25/2004 bsw     . Sulfonamide Derivatives     unknown   Scheduled Meds: . furosemide  20 mg Intravenous Q12H  . insulin aspart  0-15 Units Subcutaneous 6 times per day  . magnesium sulfate 1 - 4 g bolus IVPB  2 g Intravenous Once  . sodium chloride  10-40 mL Intracatheter Q12H   Continuous Infusions: . Marland Kitchen.TPN (CLINIMIX-E) Adult 60 mL/hr at 02/11/14 1724   And  . fat emulsion 240 mL (02/11/14 1724)  . Marland Kitchen.TPN (CLINIMIX-E) Adult     And  . fat emulsion     PRN Meds:.albuterol, LORazepam, sodium chloride  BP 112/59  Pulse 45  Temp(Src) 98 F (36.7 C) (Oral)  Resp 16  Ht 6\' 3"  (1.905 m)  Wt 83.326 kg (183 lb 11.2 oz)  BMI 22.96 kg/m2  SpO2 99%   PPS: 20%  Pain Score: denies    Intake/Output Summary (Last 24 hours) at 02/12/14 1004 Last data filed at 02/12/14 0551  Gross per 24 hour  Intake      0 ml  Output   1725 ml  Net  -1725 ml      LBM: 02/12/14      Physical Exam:  General: NAD, frail, thin, chronically ill appearing  HEENT: + severe temporal  muscle wasting, no JVD, mucous membranes dry without exudate  Chest: CTA throughout, no labored breathing, symmetric  CVS: RRR, S1 S2  Abdomen: Soft, NT, ND, hypoactive BS  Ext: BLE 2+ edema, tender to movement/touch at times, warm to touch  Neuro: Alert, oriented x 3    Labs: CBC    Component Value Date/Time   WBC 9.5 02/11/2014 0455   RBC 3.62* 02/11/2014 0455   HGB 11.9* 02/11/2014 0455   HCT 34.2* 02/11/2014 0455   PLT 97* 02/11/2014 0455   MCV 94.5 02/11/2014 0455   MCH 32.9 02/11/2014 0455   MCHC 34.8 02/11/2014 0455   RDW 14.2 02/11/2014 0455   LYMPHSABS 1.0 02/11/2014 0455   MONOABS 0.8 02/11/2014 0455   EOSABS 0.2 02/11/2014 0455   BASOSABS 0.0 02/11/2014 0455    BMET    Component Value Date/Time   NA 143 02/12/2014 0535   K 3.8 02/12/2014 0535   CL 105 02/12/2014 0535   CO2 28 02/12/2014 0535   GLUCOSE 128* 02/12/2014 0535   BUN 45* 02/12/2014 0535   CREATININE 0.92 02/12/2014 0535   CALCIUM 8.9 02/12/2014 0535   GFRNONAA  75* 02/12/2014 0535   GFRAA 87* 02/12/2014 0535    CMP     Component Value Date/Time   NA 143 02/12/2014 0535   K 3.8 02/12/2014 0535   CL 105 02/12/2014 0535   CO2 28 02/12/2014 0535   GLUCOSE 128* 02/12/2014 0535   BUN 45* 02/12/2014 0535   CREATININE 0.92 02/12/2014 0535   CALCIUM 8.9 02/12/2014 0535   PROT 5.9* 02/11/2014 0455   ALBUMIN 2.4* 02/11/2014 0455   AST 17 02/11/2014 0455   ALT 10 02/11/2014 0455   ALKPHOS 56 02/11/2014 0455   BILITOT 0.5 02/11/2014 0455   GFRNONAA 75* 02/12/2014 0535   GFRAA 87* 02/12/2014 0535     Assessment and Plan: 1. Code Status: DNR 2. Symptom Control: 1. Anxiety: Lorazepam prn.  2. Dysphagia: Able to have ice chips and drops of fluids as desired. We also discussed having some bits of ice cream and bites of food he can chew and spit out if he desires.  3. Weakness: Continue medical management for now. Focus on comfort.  3. Psycho/Social: Emotional support provided to patient and family.  4. Disposition: Hopeful for  hospice facility [facility].     Time In Time Out Total Time Spent with Patient Total Overall Time  1030 1100     Greater than 50%  of this time was spent counseling and coordinating care related to the above assessment and plan.  Yong Channel, NP Palliative Medicine Team Pager # [phone] (M-F 8a-5p) Team Phone # [phone] (Nights/Weekends)    1

## 2014-02-12 NOTE — Progress Notes (Signed)
PARENTERAL NUTRITION CONSULT NOTE - FOLLOW UP  Pharmacy Consult for TPN Indication:  Unable to obtain enteral access  Allergies  Allergen Reactions  . Codeine Phosphate     unknown  . Iohexol      Desc: contrast induced renal insufficiency in past, entered 12/25/2004 bsw     . Sulfonamide Derivatives     unknown    Patient Measurements: Height: 6\' 3"  (190.5 cm) Weight: 183 lb 11.2 oz (83.326 kg) IBW/kg (Calculated) : 84.5 Adjusted Body Weight:  Usual Weight:   Vital Signs: Temp: 98 F (36.7 C) (05/26 0550) Temp src: Oral (05/26 0550) BP: 112/59 mmHg (05/26 0550) Pulse Rate: 45 (05/26 0550) Intake/Output from previous day: 05/25 0701 - 05/26 0700 In: 0  Out: 1725 [Urine:1725] Intake/Output from this shift:    Labs:  Recent Labs  02/10/14 0445 02/11/14 0455 02/12/14 0535  WBC 8.7 9.5  --   HGB 12.1* 11.9*  --   HCT 34.6* 34.2*  --   PLT 110* 97*  --   INR  --   --  1.49     Recent Labs  02/09/14 0750 02/10/14 0445 02/11/14 0455 02/12/14 0535  NA 138 143 140 143  K 4.3 4.0 3.8 3.8  CL 104 106 105 105  CO2 23 27 25 28   GLUCOSE 168* 156* 168* 128*  BUN 39* 37* 39* 45*  CREATININE 0.82 0.92 0.89 0.92  CALCIUM 9.2 9.1 8.9 8.9  MG 1.6 1.5 1.4* 1.4*  PHOS 2.6  --  3.3  --   PROT 6.2  --  5.9*  --   ALBUMIN 2.6*  --  2.4*  --   AST 16  --  17  --   ALT 9  --  10  --   ALKPHOS 59  --  56  --   BILITOT 0.5  --  0.5  --   PREALBUMIN  --   --  9.0*  --   TRIG  --   --  72  --    Estimated Creatinine Clearance: 69.2 ml/min (by C-G formula based on Cr of 0.92).    Recent Labs  02/11/14 2017 02/12/14 0016 02/12/14 0436  GLUCAP 117* 123* 141*    Medications:  Scheduled:  . furosemide  20 mg Intravenous Q12H  . insulin aspart  0-15 Units Subcutaneous 6 times per day  . sodium chloride  10-40 mL Intracatheter Q12H    Insulin Requirements in the past 24 hours:  6 units of mod SSI, 15 units/bag in TPN   Current Nutrition:  Clinimix-E 5/20 at  42ml/hr, goal rate of 58ml/hr + 20% fat emulsion at 74ml/hr to provide:  108g/day protein, 2380Kcal/day.   Nutritional Goals:  2300-2500 kCal, 115-130 grams of protein per day (estimated from TF rec per RD on 5/21)   Assessment:  80yo with progressive neuromuscular disorder, unable to walk x 4y; awaiting bicep muscle bx results. Recently hospitalized for diarrhea and discharged to SNF ~3wks ago, where unable to initiate swallowing fxn on a MBS study. Pt has consumed no solid foods x 1 month and limited liquids.  GI: GERD. NPO; could not obtain enteral access- to try for PEG by IR on 5/26 if patient agreeable Pt was on PPI pta.  Endo: DM. CBG 117-186, improved after increase in insulin in TPN  Lytes: K 3.8-  Mg 1.4-  Phos 3.3. Pt is at risk for re-feeding syndrome   Renal: CRI. Cr < 1 (Calc CrCl 19ml/min), UOP 0.9  ml/kg/hr   Pulm: RA   Cards: CHF, HTN. VSS. Lasix IV q12   Hepatobil: LFTs wnl.  TG 72, Palb 10.6 indicating nutritional deficiency at baseline  Neuro: Progressive neuromuscular d/o, awaiting muscle bx results. Few tx options   ID: Tmax 100.5 , WBC wnl, on no abx   Best Practices:  TPN Access: PICC-double lumen (5/22)  TPN day#: 5/23 >>   Plan:  1- Increase Clinimix-E 5/20 to 1080ml/hr with Lipids 10 ml/hr  2- Magnesium bolus 2gm IV X 1 3- f/u am labs, G tube placement  Talbert CageLora Everhett Bozard, PharmD Clinical Pharmacist [facility]

## 2014-02-12 NOTE — Progress Notes (Signed)
PROGRESS NOTE  Ethan Mclean OYD:[id number] DOB: [birth year] DOA: 02/06/2014 PCP: [name], MD  Interim Summary: Ethan Mclean is a 80 y.o. male has a past medical history significant for systolic heart failure (EF 25-30% on 4/26), HTN, DM, admitted after had a nerve conduction study in the [facility] office. He was discharged from the hospital service approximately 3 weeks ago after a hospitalization for diarrhea, went to a SNF where he was found to have inability to initiate swallowing function based on a MBS study. He was supposed to see neurology as an outpatient however was unable to get there. Ethan Mclean has progressively worsening loss of motor function of his lower extremities, has been unable to walk for the past 4 years. Sensory function intact. His motor function has progressively worsened leading promises to bilateral extremities, most recently spreading to upper bilateral extremities. Patient was admitted to [facility] on 02/04/2014 given inability to swallow. On 02/06/2014 he underwent nerve conduction studies to upper and lower extremities which showed a severe primary axonal peripheral neuropathy. 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 Neurology recommending muscle biopsy for further evaluation. General surgery was consulted as he underwent biopsy of right bicep muscle on 02/08/2014. Patient tolerated procedure well there are no immediate complications. On 02/09/2014 he was transferred from [facility] to [facility] to be further evaluated by neurology. Dr. Leroy Kennedy SS patient on 02/09/2014 and felt that treatment options were limited as there were no pharmacologic options that would lead to improvement. Extensive discussion regarding goals of care were held with patient and his wife. I explained results of the nerve conduction test in the diagnoses of severe primary axonal neuropathy and myositis. Patient  expressed his desire to focus on comfort and quality of life rather than undergoing further invasive/burns and procedures, particularly if this would not change endpoint. We discussed feeding options including placement of PEG tube. Initially patient was interested in this intervention as interventional radiology was consulted for PEG tube placement. Palliative care was consulted as a family meeting was held on 02/11/2014. After discussion with his wife patient decided against undergoing tube feeds as PEG tube placement was counseled. He expressed a desire to transition to inpatient hospice at [facility].   Assessment/Plan: Severe primary axonal peripheral neuropathy -  -Case was discussed with Neurology -It appears he has had progressive weakness for some time now, nonambulatory for the last 4 years.  -Underwent muscle biopsy, results are pending -Unfortunately limited treatment options.   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   Severe protein-calorie malnutrition  -TPN started -Re-consult IR for placement of feeding tube   Systolic heart failure  -Compensated  Thrombocytopenia - likely in the setting of severe malnutrition, improving  Goals of care: Extensive discussion held on 02/10/2014 regarding goals of care with patient and his wife who was present at bedside. I explained results of nerve conduction test and diagnosis of severe primary axonal neuropathy and myositis. I conveyed to them that treatment options where limited and that neurology could not offer pharmacologic intervention that would reverse underlying progressive neurologic disorder. He expressed desire to focus his care on comfort and quality of life rather than undergoing further invasive/burdensome procedures that would not change endpoint. As for feeding he wishes to proceed with PEG tube placement. IR consult made for PEG placement in am.   Overall he would like to go home with home  hospice services and focus on  quality of life. He is interested to speaking with hospice and palliative care further. Palliative care consult placed. SW consult for setting up home hospice.      IR consulted for PEG placement which is planned for Tues 02/12/14  Diet: NPO Fluids: none DVT Prophylaxis: SCD  Code Status: DNR Family Communication: wife at bedside  Disposition Plan: Discharge home with home hospice  Consultants:  Neurology   Surgery   Procedures:  None    Antibiotics - none  HPI/Subjective: - feels weak, no change from yesterday  Objective: Filed Vitals:   02/11/14 2152 02/12/14 0232 02/12/14 0500 02/12/14 0550  BP: 104/53 107/66  112/59  Pulse: 77 65  45  Temp: 97.5 F (36.4 C) 98.5 F (36.9 C)  98 F (36.7 C)  TempSrc: Oral Oral  Oral  Resp: 16 16  16   Height:      Weight:   83.326 kg (183 lb 11.2 oz)   SpO2: 99% 98%  99%    Intake/Output Summary (Last 24 hours) at 02/12/14 0817 Last data filed at 02/12/14 0551  Gross per 24 hour  Intake      0 ml  Output   1725 ml  Net  -1725 ml   Filed Weights   02/11/14 0421 02/11/14 0500 02/12/14 0500  Weight: 87.25 kg (192 lb 5.6 oz) 85.049 kg (187 lb 8 oz) 83.326 kg (183 lb 11.2 oz)   Exam:  General:  NAD  Cardiovascular: regular rate and rhythm, without MRG  Respiratory: good air movement, clear to auscultation throughout, no wheezing, ronchi or rales  Abdomen: soft, not tender to palpation, positive bowel sounds  MSK: 2+ pitting LE edema  Neuro: 3/5 muscle strength to b/l upper extremities, appears to have flaccid paralysis on b/l lower extremities. Reflexes were absent. No slurred speech or facial droop  Data Reviewed: Basic Metabolic Panel:  Recent Labs Lab 02/08/14 0440 02/09/14 0750 02/10/14 0445 02/11/14 0455 02/12/14 0535  NA 138 138 143 140 143  K 4.4 4.3 4.0 3.8 3.8  CL 103 104 106 105 105  CO2 23 23 27 25 28   GLUCOSE 79 168* 156* 168* 128*  BUN 44* 39* 37* 39* 45*   CREATININE 1.08 0.82 0.92 0.89 0.92  CALCIUM 9.2 9.2 9.1 8.9 8.9  MG 1.7 1.6 1.5 1.4* 1.4*  PHOS 2.9 2.6  --  3.3  --    Liver Function Tests:  Recent Labs Lab 02/07/14 0502 02/08/14 0440 02/09/14 0750 02/11/14 0455  AST 14 17 16 17   ALT 11 10 9 10   ALKPHOS 54 55 59 56  BILITOT 0.5 0.6 0.5 0.5  PROT 6.0 6.0 6.2 5.9*  ALBUMIN 2.4* 2.6* 2.6* 2.4*   CBC:  Recent Labs Lab 02/07/14 0502 02/08/14 0440 02/10/14 0445 02/11/14 0455  WBC 8.3 8.0 8.7 9.5  NEUTROABS  --  6.1  --  7.6  HGB 12.2* 12.6* 12.1* 11.9*  HCT 36.0* 36.8* 34.6* 34.2*  MCV 93.0 92.5 93.3 94.5  PLT 119* 123* 110* 97*   Cardiac Enzymes:  Recent Labs Lab 02/06/14 1807  CKTOTAL 57   BNP (last 3 results)  Recent Labs  01/11/14 1444 01/15/14 0700 02/04/14 1809  PROBNP 9946.0* 2541.0* 5728.0*   Studies: No results found. Scheduled Meds: . furosemide  20 mg Intravenous Q12H  . insulin aspart  0-15 Units Subcutaneous 6 times per day  . magnesium sulfate 1 - 4 g bolus IVPB  2 g Intravenous Once  . sodium chloride  10-40  mL Intracatheter Q12H   Continuous Infusions: . Marland KitchenTPN (CLINIMIX-E) Adult 60 mL/hr at 02/11/14 1724   And  . fat emulsion 240 mL (02/11/14 1724)  . Marland KitchenTPN (CLINIMIX-E) Adult     And  . fat emulsion     Active Problems:   DYSLIPIDEMIA   Chronic systolic heart failure   Bilateral leg weakness   Bilateral arm weakness   Protein-calorie malnutrition, severe   Neuropathy  Time spent: 35 min, 25 min spent in face to face discussion with patient and wife regarding goals of care.   This note has been created with Education officer, environmental. Any transcriptional errors are unintentional.    02/12/2014, 8:17 AM  LOS: 6 days

## 2014-02-13 ENCOUNTER — Ambulatory Visit: Payer: Medicare Other | Admitting: Neurology

## 2014-02-13 DIAGNOSIS — M609 Myositis, unspecified: Secondary | ICD-10-CM | POA: Diagnosis present

## 2014-02-13 DIAGNOSIS — G608 Other hereditary and idiopathic neuropathies: Secondary | ICD-10-CM

## 2014-02-13 DIAGNOSIS — IMO0001 Reserved for inherently not codable concepts without codable children: Secondary | ICD-10-CM

## 2014-02-13 DIAGNOSIS — G6289 Other specified polyneuropathies: Secondary | ICD-10-CM | POA: Diagnosis present

## 2014-02-13 LAB — MAGNESIUM: Magnesium: 1.8 mg/dL (ref 1.5–2.5)

## 2014-02-13 LAB — GLUCOSE, CAPILLARY
GLUCOSE-CAPILLARY: 145 mg/dL — AB (ref 70–99)
GLUCOSE-CAPILLARY: 160 mg/dL — AB (ref 70–99)
Glucose-Capillary: 146 mg/dL — ABNORMAL HIGH (ref 70–99)
Glucose-Capillary: 126 mg/dL — ABNORMAL HIGH (ref 70–99)

## 2014-02-13 LAB — RENAL FUNCTION PANEL
Albumin: 2.3 g/dL — ABNORMAL LOW (ref 3.5–5.2)
BUN: 54 mg/dL — ABNORMAL HIGH (ref 6–23)
CALCIUM: 9 mg/dL (ref 8.4–10.5)
CO2: 28 meq/L (ref 19–32)
CREATININE: 1.03 mg/dL (ref 0.50–1.35)
Chloride: 104 mEq/L (ref 96–112)
GFR calc Af Amer: 74 mL/min — ABNORMAL LOW (ref >90)
GFR calc non Af Amer: 64 mL/min — ABNORMAL LOW (ref >90)
Glucose, Bld: 150 mg/dL — ABNORMAL HIGH (ref 70–99)
PHOSPHORUS: 3.5 mg/dL (ref 2.3–4.6)
Potassium: 3.6 mEq/L — ABNORMAL LOW (ref 3.7–5.3)
Sodium: 142 mEq/L (ref 137–147)

## 2014-02-13 MED ORDER — TRACE MINERALS CR-CU-F-FE-I-MN-MO-SE-ZN IV SOLN
INTRAVENOUS | Status: DC
  Filled 2014-02-13: qty 2000

## 2014-02-13 MED ORDER — FAT EMULSION 20 % IV EMUL
240.0000 mL | INTRAVENOUS | Status: DC
  Filled 2014-02-13: qty 250

## 2014-02-13 MED ORDER — MORPHINE SULFATE (CONCENTRATE) 10 MG /0.5 ML PO SOLN: 5.0000 mg | ORAL | Status: AC | PRN

## 2014-02-13 MED ORDER — LORAZEPAM 2 MG/ML IJ SOLN: 0.5000 mg | INTRAMUSCULAR | Status: AC | PRN

## 2014-02-13 MED ORDER — BISACODYL 10 MG RE SUPP: 10.0000 mg | Freq: Every day | RECTAL | Status: AC | PRN

## 2014-02-13 MED ORDER — POTASSIUM CHLORIDE 10 MEQ/100ML IV SOLN
10.0000 meq | INTRAVENOUS | Status: AC
  Administered 2014-02-13 (×4): 10 meq via INTRAVENOUS
  Filled 2014-02-13 (×4): qty 100

## 2014-02-13 NOTE — Discharge Summary (Signed)
Physician Discharge Summary  Ronny BaconDewey C Imler ZOX:[id number] DOB: 1926/07/04 DOA: 02/06/2014  PCP: Lenora BoysFRIED, ROBERT L, MD  Admit date: 02/06/2014 Discharge date: 02/13/2014  Time spent: 65 minutes  Recommendations for Outpatient Follow-up:  1. [facility].  Discharge Diagnoses:  Principal Problem:   Peripheral axonal neuropathy Active Problems:   Myositis   DYSLIPIDEMIA   Chronic systolic heart failure   Bilateral leg weakness   Bilateral arm weakness   Protein-calorie malnutrition, severe   Neuropathy   Palliative care encounter   Discharge Condition: Stable  Diet recommendation: NPO. May have ice chips and drops of fluids as desired. Comfort feeds  Filed Weights   02/11/14 0500 02/12/14 0500 02/13/14 0500  Weight: 85.049 kg (187 lb 8 oz) 83.326 kg (183 lb 11.2 oz) 82.01 kg (180 lb 12.8 oz)    History of present illness:  Ronny BaconDewey C Sobczyk is a 80 y.o. male has a past medical history significant for systolic heart failure (EF 25-30% on 4/26), HTN, DM, admitted after had a nerve conduction study in the [facility] office. He was discharged from the hospital service approximately 3 weeks ago after a hospitalization for diarrhea, went to a SNF where he was found to have inability to initiate swallowing function based on a MBS study. He was supposed to see neurology as an outpatient however was unable to get there. As previously described in Dr. Chancy MilroyKrishnan's H&P, [name] had progressively worsening loss of motor function of his lower extremities to the point now where he could barely move his feet. This has not affected his sensation. He also noticed progressively worsening decline in upper extremity function, but still has some strength and loss of fine motor control and decreasing gross motor control.  He was discharged this [facility] and is now back.      Hospital Course:  . 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 male has a past medical history significant for systolic heart failure (EF 25-30% on 4/26), HTN, DM, admitted after had a nerve conduction study in the [facility] office. He was discharged from the hospital service approximately 3 weeks ago after a hospitalization for diarrhea, went to a SNF where he was found to have inability to initiate swallowing function based on a MBS study. He was supposed to see neurology as an outpatient however was unable to get there. [name] has progressively worsening loss of motor function of his lower extremities, has been unable to walk for the past 4 years. Sensory function intact. His motor function has progressively worsened leading promises to bilateral extremities, most recently spreading to upper bilateral extremities. [facility]2014 given inability to swallow. On 02/06/2014 he underwent nerve conduction studies to upper and lower extremities which showed a severe primary axonal peripheral neuropathy. 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 Neurology recommending muscle biopsy for further evaluation. General surgery was consulted as he underwent biopsy of right bicep muscle on 02/08/2014. Patient tolerated procedure well there are no immediate complications. On 02/09/2014 [facility] to be further evaluated by neurology. Dr. Leroy Kennedyamilo who saw patient on 02/09/2014 and felt that treatment options were limited as there were no pharmacologic options that would lead to improvement. Extensive discussion regarding goals of care were held with patient and his wife. I explained results  of the nerve conduction test in the diagnoses of severe primary axonal neuropathy and myositis. Patient expressed his desire to focus on comfort and quality of life rather than undergoing further  invasive/burns and procedures, particularly if this would not change endpoint. We discussed feeding options including placement of PEG tube. Initially patient was interested in this intervention as interventional radiology was consulted for PEG tube placement. Palliative care was consulted as a family meeting was held on 02/11/2014. After discussion with his wife patient decided against undergoing tube feeds as PEG tube placement was counseled. He expressed a desire to transition to inpatient hospice at [facility].  Patient remained stable was maintained on TNA during the hospitalization which will be discontinued on discharge. Patient will be discharged to be [facility] today.  Procedures:  Chest x-ray 02/08/2014  PICC line placement 02/08/2014   Muscle biopsy 02/08/2014 Dr. Romie Levee  Consultations: Neurology : Dr. Leroy Kennedy 02/07/2014 Surgery: Dr. Romie Levee 02/07/2014 Palliative care: Yong Channel nurse practitioner 02/11/2014  Discharge Exam: Filed Vitals:   02/13/14 1003  BP: 110/66  Pulse: 93  Temp: 98.6 F (37 C)  Resp: 18    General: NAD Cardiovascular: RRR Respiratory: CTAB anterior lung fields.  Discharge Instructions You were cared for by a hospitalist during your hospital stay. If you have any questions about your discharge medications or the care you received while you were in the hospital after you are discharged, you can call the unit and asked to speak with the hospitalist on call if the hospitalist that took care of you is not available. Once you are discharged, your primary care physician will handle any further medical issues. Please note that NO REFILLS for any discharge medications will be authorized once you are discharged, as it is imperative that you return to your primary care physician (or establish a relationship with a primary care physician if you do not have one) for your aftercare needs so that they can reassess your need for medications and  monitor your lab values.      Discharge Instructions   Discharge instructions    Complete by:  As directed   F/u with Dr Barbee Shropshire,     Increase activity slowly    Complete by:  As directed             Medication List    STOP taking these medications       aspirin EC 325 MG tablet     calcium carbonate 500 MG chewable tablet  Commonly known as:  TUMS - dosed in mg elemental calcium     HYDROcodone-acetaminophen 5-325 MG per tablet  Commonly known as:  NORCO/VICODIN     loratadine 10 MG tablet  Commonly known as:  CLARITIN     LORazepam 0.5 MG tablet  Commonly known as:  ATIVAN  Replaced by:  LORazepam 2 MG/ML injection     OMEGA 3 PO     omeprazole 20 MG tablet  Commonly known as:  PRILOSEC OTC     PROBIOTIC COLON SUPPORT Caps     psyllium 58.6 % packet  Commonly known as:  METAMUCIL     sodium chloride 0.65 % Soln nasal spray  Commonly known as:  OCEAN      TAKE these medications       acetaminophen 500 MG tablet  Commonly known as:  TYLENOL  Take 500 mg by mouth every other day as needed for mild pain.     bisacodyl 10 MG suppository  Commonly known as:  DULCOLAX  Place 1 suppository (10 mg total) rectally daily as needed for moderate constipation.     LORazepam 2 MG/ML injection  Commonly known as:  ATIVAN  Inject 0.25 mLs (0.5 mg total) into the vein every 4 (four) hours as needed for anxiety.     morphine CONCENTRATE 10 mg / 0.5 ml concentrated solution  Take 0.25 mLs (5 mg total) by mouth every hour as needed for moderate pain or shortness of breath.     oxymetazoline 0.05 % nasal spray  Commonly known as:  AFRIN  Place 1 spray into both nostrils 2 (two) times daily as needed for congestion.     SYSTANE 0.4-0.3 % Soln  Generic drug:  Polyethyl Glycol-Propyl Glycol  Place 1 drop into both eyes 2 (two) times daily.       Allergies  Allergen Reactions  . Codeine Phosphate     unknown  . Iohexol      Desc: contrast induced renal  insufficiency in past, entered 12/25/2004 bsw     . Sulfonamide Derivatives     unknown   Follow-up Information   Follow up with Olene Craven, MD.   Specialty:  Internal Medicine   Contact information:   [address] [facility] [place] [address] [phone]        The results of significant diagnostics from this hospitalization (including imaging, microbiology, ancillary and laboratory) are listed below for reference.    Significant Diagnostic Studies: Mr Lodema Pilot Contrast  Feb 10, 2014   CLINICAL DATA:  Difficulty swallowing.  Possible ALS.  EXAM: MRI HEAD WITHOUT AND WITH CONTRAST  TECHNIQUE: Multiplanar, multiecho pulse sequences of the brain and surrounding structures were obtained without and with intravenous contrast.  CONTRAST:  18mL MULTIHANCE GADOBENATE DIMEGLUMINE 529 MG/ML IV SOLN  COMPARISON:  None.  FINDINGS: No acute infarct.  No intracranial hemorrhage.  No intracranial MR findings of ALS.  Mild to moderate small vessel disease type changes.  Global atrophy without hydrocephalus.  No intracranial mass or abnormal enhancement.  Major intracranial vascular structures are patent.  Partial opacification mastoid air cells bilaterally without obstructing lesion noted in the region of the posterior superior nasopharynx causing eustachian tube dysfunction.  Mild transverse ligament hypertrophy. Minimal spur C3-4 with very mild spinal stenosis.  IMPRESSION: No acute infarct.  No intracranial MR findings of ALS.  Mild to moderate small vessel disease type changes.  Global atrophy without hydrocephalus.  No intracranial mass or abnormal enhancement.  Partial opacification mastoid air cells bilaterally   Electronically Signed   By: Bridgett Larsson M.D.   On: Feb 10, 2014 14:46   Dg Chest Port 1 View  02/08/2014   CLINICAL DATA:  Confirm line placement  EXAM: PORTABLE CHEST - 1 VIEW  COMPARISON:  DG ABD ACUTE W/CHEST dated 01/11/2014  FINDINGS: A PICC line has  been placed via the left upper extremity. The catheter tip lies in the mid portion of the SVC. There is no postprocedure complication. The lungs are reasonably well inflated. There is no focal infiltrate. The cardiac silhouette and pulmonary vascularity are normal. There is calcification in the wall of the aortic arch.  IMPRESSION: No postprocedure complication following placement of the left-sided PICC line.   Electronically Signed   By: David  [place]   On: 02/08/2014 15:05   Dg Fluoro Rm 1-60 Min - No Report  02/07/2014   CLINICAL DATA: would like a post pyloric NJ for feeding. thank you.  FLUORO ROOM 1-60 MINUTES  Fluoroscopy was utilized by the requesting physician.  No radiographic  interpretation.    Dg Swallowing Func-speech Pathology  01/30/2014   Gray Bernhardt, CCC-SLP     01/30/2014 12:42 PM Objective Swallowing Evaluation: Modified Barium Swallowing Study   Patient Details  Name: NIKOLUS MARCZAK MRN: [id number] Date of Birth: October 13, 1925  Today's Date: 01/30/2014 Time: 1105-1205 SLP Time Calculation (min): 60 min  Past Medical History:  Past Medical History  Diagnosis Date  . CHF (congestive heart failure)   . DVT (deep venous thrombosis)     "legs"  . Complication of anesthesia     "slow to wake up after eye OR"  . Type II diabetes mellitus     "dx'd years ago; don't have it anymroe" (01/11/2014)  . GERD (gastroesophageal reflux disease)   . Gout   . H/O echocardiogram     nonischemi cardiomyopathy EF 40-45%   . Asymptomatic PVCs   . Hypertension   . Statin intolerance   . Balance problems   . Chronic renal insufficiency   . Carpal tunnel syndrome on right   . Gout   . Osteoarthritis   . H/O CHF W/ LE Edema  . Hx of adenomatous colonic polyps   . Chronic edema     LE from chronic  DVTS   Past Surgical History:  Past Surgical History  Procedure Laterality Date  . Appendectomy    . Vena cava filter placement    . Cataract extraction w/ intraocular lens implant Left   . Tonsillectomy    . Eye surgery  Bilateral     "to retain tears"  . Cardiac catheterization  12/2001    Normal coronary arteries by cath [id number]   HPI:  80 year old male refered for outpatient MBS due to ~1 week  history of choking. Per caregiver, pt has history of  neuromuscular disorder affecting the lower extremity  Other PMH  is significant for CHF, DM, GERD (not on PPI), Gout.       Assessment / Plan / Recommendation Clinical Impression  Dysphagia Diagnosis: Severe pharyngeal phase dysphagia;Moderate  oral phase dysphagia Clinical impression: Limited po presentation during this study,  due to the fact that pt exhibited NO swallow reflex.  There was  no visible laryngeal or epiglottic movement, and reduced tongue  movement.  Pt reported "I just swallowed as hard as I could", but  nothing was visualized.  Once vallecular and pyriform sinuses  folled to capacity, spill into the laryngeal vestibule was noted.  Voice quality was noted to change, and pt began coughing and  clearing his throat.  Stasis was then suctioned from the  oropharynx and the study was terminated.  Of note, pt was unable  to raise eyebrows or close his eyes, and labial and lingual  movement were reduced. Palatal elevation appeared adequate, and  pt demonstrated gag reflex during oral suctioning.  Neurological  component is highly likely, and further workup by neurologist is  strongly encouraged.  Facial, Glossopharyngeal, Vagus, Spinal  Accessory and Hypoglossal nerves all appear to be adversely  affected, based on presentation, weakness, and  caregiver/pt  report.  Results were reviewed with pt and caregiver, who had  been communicating with pt's daughter throughout the MBS.   Caregiver was encouraged to contact PCP today (pt has appointment  tomorrow), as SLP is unable to recommend safe po diet at this  time.  Neurological consult is strongly recommended to determine  etiology of  pt's deficits.  NPO status is recommended at this  time, with consideration of nonoral feeding  method for safe and  adequate nutrition and hydration.    Treatment Recommendation  Defer treatment plan to SLP at (Comment) (pending neurological  consultation and results)    Diet Recommendation NPO   Medication Administration: Via alternative means    Other  Recommendations Recommended Consults: Other (Comment)  (Neurologist) Oral Care Recommendations: Oral care BID   Follow Up Recommendations       Frequency and Duration   n/a     Pertinent Vitals/Pain VSS, no pain reported    SLP Swallow Goals   n/a at this time. Pending neurological workup/results and  recommendations   General Date of Onset: 01/18/14 HPI: 80 year old male refered for outpatient MBS due to ~1 week  history of choking. Per caregiver, pt has history of  neuromuscular disorder affecting the lower extremity  Other PMH  is significant for CHF, DM, GERD (not on PPI), Gout.   Type of Study: Modified Barium Swallowing Study Reason for Referral: Objectively evaluate swallowing function Previous Swallow Assessment: n/a Diet Prior to this Study: Regular;Thin liquids Temperature Spikes Noted: No Respiratory Status: Room air History of Recent Intubation: No Behavior/Cognition: Alert;Cooperative;Pleasant  mood;Confused;Requires cueing Oral Cavity - Dentition: Adequate natural dentition Oral Motor / Sensory Function: Impaired motor;Impaired sensory Oral impairment: Right facial;Left facial;Right lingual;Left  lingual;Right labial;Left labial Self-Feeding Abilities: Needs assist Patient Positioning: Upright in chair Baseline Vocal Quality: Clear Volitional Cough: Weak Volitional Swallow: Unable to elicit Anatomy: Within functional limits Pharyngeal Secretions: Not observed secondary MBS (Pt reports  globus sensation of phlegm)    Reason for Referral Objectively evaluate swallowing function   Oral Phase Oral Preparation/Oral Phase Oral Phase: Impaired Oral - Nectar Oral - Nectar Teaspoon: Weak lingual manipulation;Lingual  pumping;Reduced posterior  propulsion;Delayed oral transit Oral - Thin Oral - Thin Teaspoon: Weak lingual manipulation;Lingual  pumping;Reduced posterior propulsion;Delayed oral transit Oral - Solids Oral - Puree: Reduced posterior propulsion;Delayed oral  transit;Weak lingual manipulation;Lingual pumping   Pharyngeal Phase Pharyngeal Phase Pharyngeal Phase: Impaired Pharyngeal - Nectar Pharyngeal - Nectar Teaspoon: Premature spillage to  valleculae;Premature spillage to pyriform  sinuses;Penetration/Aspiration before swallow;Reduced  airway/laryngeal closure;Reduced epiglottic inversion;Reduced  pharyngeal peristalsis;Reduced tongue base retraction;Delayed  swallow initiation;Reduced laryngeal elevation Penetration/Aspiration details (nectar teaspoon): Material enters  airway, remains ABOVE vocal cords and not ejected out Pharyngeal - Thin Pharyngeal - Thin Teaspoon: Penetration/Aspiration before  swallow;Premature spillage to valleculae;Premature spillage to  pyriform sinuses;Reduced airway/laryngeal closure;Reduced  laryngeal elevation;Reduced anterior laryngeal mobility;Reduced  epiglottic inversion;Reduced pharyngeal peristalsis;Reduced  tongue base retraction;Delayed swallow initiation Penetration/Aspiration details (thin teaspoon): Material enters  airway, remains ABOVE vocal cords and not ejected out Pharyngeal - Solids Pharyngeal - Puree: Penetration/Aspiration before  swallow;Premature spillage to valleculae;Premature spillage to  pyriform sinuses;Reduced airway/laryngeal closure;Reduced  laryngeal elevation;Reduced anterior laryngeal mobility;Reduced  epiglottic inversion;Delayed swallow initiation;Reduced  pharyngeal peristalsis;Reduced tongue base retraction Penetration/Aspiration details (puree): Material enters airway,  remains ABOVE vocal cords and not ejected out Pharyngeal Phase - Comment Pharyngeal Comment: ABSENT SWALLOW REFLEX, NO ANTERIOR LARYNGEAL  MOVEMENT/ELEVATION  Cervical Esophageal Phase    GO    Cervical  Esophageal Phase Cervical Esophageal Phase:  (NOT OBSERVED)    Functional Assessment Tool Used: Clinical judgment andd objective  evaluation of swallow, ASHA noms Functional Limitations: Swallowing Swallow Current Status (F7588): 100 percent impaired, limited or  restricted Swallow Goal Status (T2549): 100 percent impaired, limited or  restricted Swallow Discharge Status (I2641): 100 percent impaired, limited  or restricted   Celia B. Murvin Natal [facility], CCC-SLP [phone]  Gray Bernhardt 01/30/2014, 12:41 PM     Microbiology: Recent Results (from the past 240 hour(s))  SURGICAL PCR SCREEN     Status: Abnormal   Collection Time    02/08/14  6:26 AM      Result Value Ref Range Status   MRSA, PCR POSITIVE (*) NEGATIVE Final   Comment: RESULT CALLED TO, READ BACK BY AND VERIFIED WITH:     C WHITE AT 0829 ON 05.22.2015 BY NBROOKS   Staphylococcus aureus POSITIVE (*) NEGATIVE Final   Comment:            The Xpert SA Assay (FDA     approved for NASAL specimens     in patients over 57 years of age),     is one component of     a comprehensive surveillance     program.  Test performance has     been validated by [employer] for patients greater     than or equal to 38 year old.     It is not intended     to diagnose infection nor to     guide or monitor treatment.     Labs: Basic Metabolic Panel:  Recent Labs Lab 02/08/14 0440 02/09/14 0750 02/10/14 0445 02/11/14 0455 02/12/14 0535 02/13/14 0618  NA 138 138 143 140 143 142  K 4.4 4.3 4.0 3.8 3.8 3.6*  CL 103 104 106 105 105 104  CO2 23 23 27 25 28 28   GLUCOSE 79 168* 156* 168* 128* 150*  BUN 44* 39* 37* 39* 45* 54*  CREATININE 1.08 0.82 0.92 0.89 0.92 1.03  CALCIUM 9.2 9.2 9.1 8.9 8.9 9.0  MG 1.7 1.6 1.5 1.4* 1.4* 1.8  PHOS 2.9 2.6  --  3.3  --  3.5   Liver Function Tests:  Recent Labs Lab 02/07/14 0502 02/08/14 0440 02/09/14 0750 02/11/14 0455 02/13/14 0618  AST 14 17 16 17   --   ALT 11 10 9 10   --   ALKPHOS  54 55 59 56  --   BILITOT 0.5 0.6 0.5 0.5  --   PROT 6.0 6.0 6.2 5.9*  --   ALBUMIN 2.4* 2.6* 2.6* 2.4* 2.3*   No results found for this basename: LIPASE, AMYLASE,  in the last 168 hours No results found for this basename: AMMONIA,  in the last 168 hours CBC:  Recent Labs Lab 02/07/14 0502 02/08/14 0440 02/10/14 0445 02/11/14 0455  WBC 8.3 8.0 8.7 9.5  NEUTROABS  --  6.1  --  7.6  HGB 12.2* 12.6* 12.1* 11.9*  HCT 36.0* 36.8* 34.6* 34.2*  MCV 93.0 92.5 93.3 94.5  PLT 119* 123* 110* 97*   Cardiac Enzymes:  Recent Labs Lab 02/06/14 1807  CKTOTAL 57   BNP: BNP (last 3 results)  Recent Labs  01/11/14 1444 01/15/14 0700 02/04/14 1809  PROBNP 9946.0* 2541.0* 5728.0*   CBG:  Recent Labs Lab 02/12/14 2033 02/13/14 0019 02/13/14 0515 02/13/14 0908 02/13/14 1214  GLUCAP 140* 160* 146* 145* 126*       Signed:  Rodolph Bong MD [facility] 02/13/2014, 2:38 PM

## 2014-02-13 NOTE — Consult Note (Signed)
[facility] Liaison: [employer] available for Mr. Neva Seat today. Spoke with spouse by phone and son and daughter in law in person to confirm interest and complete transfer paper work. CSW and RNCM aware. Dr. Producer, television/film/video with CSW [place]. Donald Hertweck to assume care per spouse's choice. Please fax discharge summary to [phone] and have RN call report to [phone]. PLEASE LEAVE PICC LINE IN PLACE FOR USE AT [facility]. Thank you. Forrestine Him LCSW [phone]

## 2014-02-13 NOTE — Clinical Social Work Note (Addendum)
CSW received confirmation from [facility] regarding pt and pt's family acceptance of [facility] bed offer. Pt to be transferred to [facility] hospice once medically stable for discharge. CSW has updated MD regarding [facility] bed availability for 02/13/2014. CSW to continue to follow and assist with discharge planning needs.  2:21pm CSW received notification from admissions liaison at [facility] that pt's bed has been delivered To [facility]. Pt to be transported to [facility] residential hospice via [facility]). Pt's family (wife) have been updated and are agreeable to discharge disposition. RN to please call report to [facility] residential hospice at [phone].  2:55pm Discharge summary has been faxed to [facility] residential hospice. EMS [facility]) has been arranged.  Darlyn Chamber, LCSWA Clinical Social Worker [phone]

## 2014-02-13 NOTE — Progress Notes (Signed)
PARENTERAL NUTRITION CONSULT NOTE - FOLLOW UP  Pharmacy Consult for TPN Indication:  Unable to obtain enteral access  Allergies  Allergen Reactions  . Codeine Phosphate     unknown  . Iohexol      Desc: contrast induced renal insufficiency in past, entered 12/25/2004 bsw     . Sulfonamide Derivatives     unknown    Patient Measurements: Height: 6\' 3"  (190.5 cm) Weight: 180 lb 12.8 oz (82.01 kg) IBW/kg (Calculated) : 84.5 Adjusted Body Weight:  Usual Weight:   Vital Signs: Temp: 98.6 F (37 C) (05/26 2209) Temp src: Oral (05/26 2209) BP: 120/67 mmHg (05/26 2209) Pulse Rate: 69 (05/26 2209) Intake/Output from previous day: 05/26 0701 - 05/27 0700 In: 50 [IV Piggyback:50] Out: 2025 [Urine:2025] Intake/Output from this shift:    Labs:  Recent Labs  02/11/14 0455 02/12/14 0535  WBC 9.5  --   HGB 11.9*  --   HCT 34.2*  --   PLT 97*  --   INR  --  1.49     Recent Labs  02/11/14 0455 02/12/14 0535 02/13/14 0618  NA 140 143 142  K 3.8 3.8 3.6*  CL 105 105 104  CO2 25 28 28   GLUCOSE 168* 128* 150*  BUN 39* 45* 54*  CREATININE 0.89 0.92 1.03  CALCIUM 8.9 8.9 9.0  MG 1.4* 1.4*  --   PHOS 3.3  --  3.5  PROT 5.9*  --   --   ALBUMIN 2.4*  --  2.3*  AST 17  --   --   ALT 10  --   --   ALKPHOS 56  --   --   BILITOT 0.5  --   --   PREALBUMIN 9.0*  --   --   TRIG 72  --   --    Estimated Creatinine Clearance: 60.8 ml/min (by C-G formula based on Cr of 1.03).    Recent Labs  02/12/14 2033 02/13/14 0019 02/13/14 0515  GLUCAP 140* 160* 146*    Medications:  Scheduled:  . furosemide  20 mg Intravenous Q12H  . insulin aspart  0-15 Units Subcutaneous 6 times per day  . potassium chloride  10 mEq Intravenous Q1 Hr x 4  . sodium chloride  10-40 mL Intracatheter Q12H    Insulin Requirements in the past 24 hours:  5 units of mod SSI, 15 units/bag in TPN   Current Nutrition:  Clinimix-E 5/20 at 68ml/hr, goal rate of 16ml/hr + 20% fat emulsion at  8ml/hr to provide:  108g/day protein, 2380Kcal/day.   Nutritional Goals:  2300-2500 kCal, 115-130 grams of protein per day (estimated from TF rec per RD on 5/21)   Assessment:  80yo with progressive neuromuscular disorder, unable to walk x 4y; awaiting bicep muscle bx results. Recently hospitalized for diarrhea and discharged to SNF ~3wks ago, where unable to initiate swallowing fxn on a MBS study. Pt has consumed no solid foods x 1 month and limited liquids.  GI: GERD. NPO; could not obtain enteral access- to try for PEG by IR  if patient agreeable Pt was on PPI pta.  Endo: DM. CBG <150, improved after increase in insulin in TPN  Lytes: K 3.6-  Phos 3.5  MD repleting K  Pt is at risk for re-feeding syndrome   Renal: CRI. Cr 1.03 (Calc CrCl 56ml/min), UOP 1 ml/kg/hr   Pulm: RA   Cards: CHF, HTN. VSS. Lasix IV q12   Hepatobil: LFTs wnl.  TG 72,  Palb 9.0   Neuro: Progressive neuromuscular d/o, awaiting muscle bx results. Few tx options   ID: Afeb , WBC wnl, on no abx   Best Practices:  TPN Access: PICC-double lumen (5/22)  TPN day#: 5/23 >>   Plan:  1- Increase Clinimix-E 5/20 to 5583ml/hr with Lipids 10 ml/hr  2- f/u am labs, G tube placement  Talbert CageLora Alieyah Spader, PharmD Clinical Pharmacist [facility]

## 2014-02-13 NOTE — Progress Notes (Signed)
Report given to [facility].  

## 2014-02-18 ENCOUNTER — Encounter (HOSPITAL_COMMUNITY): Payer: Self-pay

## 2014-02-18 DEATH — deceased

## 2014-02-22 ENCOUNTER — Encounter (HOSPITAL_COMMUNITY): Payer: Self-pay

## 2014-02-27 ENCOUNTER — Encounter (HOSPITAL_COMMUNITY): Payer: Self-pay | Admitting: General Surgery

## 2014-02-27 NOTE — OR Nursing (Signed)
Procedure end time added 

## 2014-03-01 ENCOUNTER — Encounter: Payer: Medicare Other | Admitting: Cardiology

## 2014-03-13 NOTE — Consult Note (Signed)
I have reviewed and discussed the care of this patient in detail with the nurse practitioner including pertinent patient records, physical exam findings and data. I agree with details of this encounter.  

## 2014-03-25 ENCOUNTER — Encounter (INDEPENDENT_AMBULATORY_CARE_PROVIDER_SITE_OTHER): Payer: Self-pay

## 2014-08-07 ENCOUNTER — Encounter: Payer: Self-pay | Admitting: Neurology

## 2014-08-13 ENCOUNTER — Encounter: Payer: Self-pay | Admitting: Neurology

## 2015-03-26 IMAGING — CT CT ABD-PELV W/O CM
2 of 4 series · 16 of 46 positions shown, 18 images · IV contrast (iohexol)
Comparison: CT abdomen 12/03/2003

CLINICAL DATA: Diarrhea, ileus, history diabetes, hypertension,
CHF, colonic diverticulosis

EXAM:
CT ABDOMEN AND PELVIS WITHOUT CONTRAST
TECHNIQUE: Multidetector CT imaging of the abdomen and pelvis was performed
following the standard protocol without intravenous contrast.
Sagittal and coronal MPR images reconstructed from axial data set.
Patient drank dilute oral contrast for exam. IV contrast not
administered due to history of Iohexol allergy.

[Series 2: abd/ pelvis 5.0 i30f 1 · axial · 0.86mm/px · z∈[-342,+123]mm · 13 of 103 slices shown, 15 images]
[im 5/103  soft-tissue]
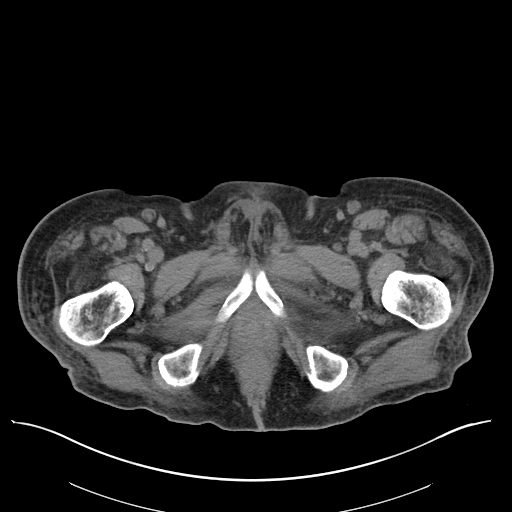
[im 5/103  bone]
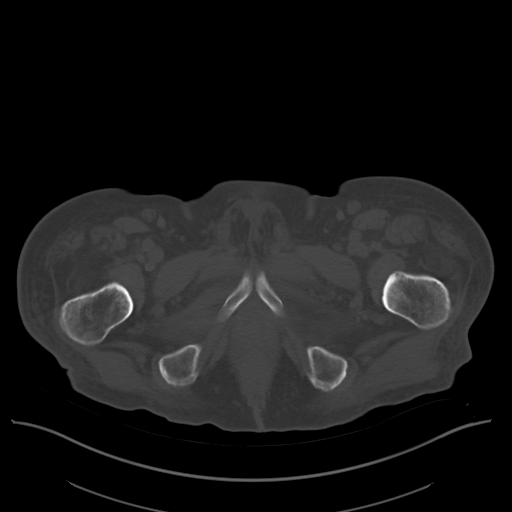
[im 14/103  soft-tissue]
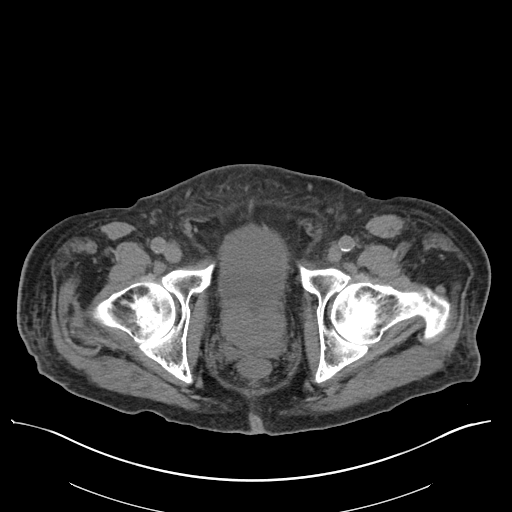
[im 23/103  soft-tissue]
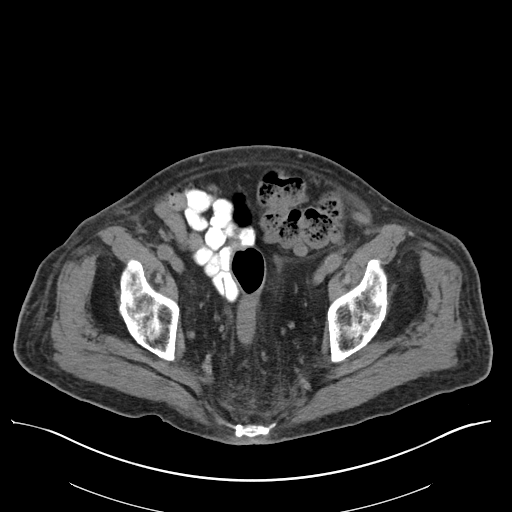
[im 27/103  soft-tissue]
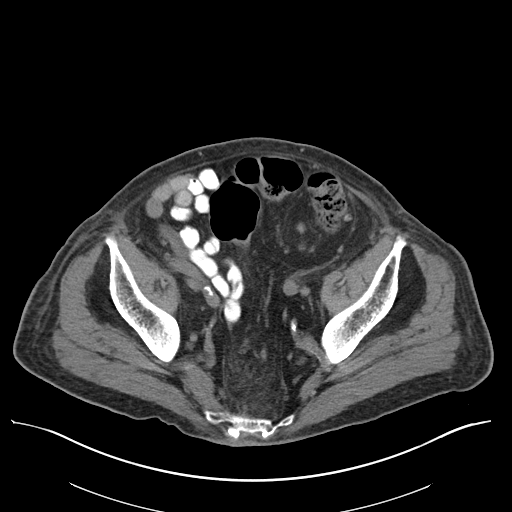
[im 36/103  soft-tissue]
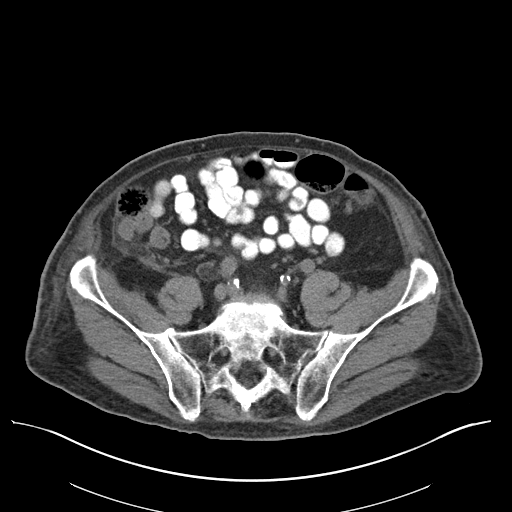
[im 45/103  soft-tissue]
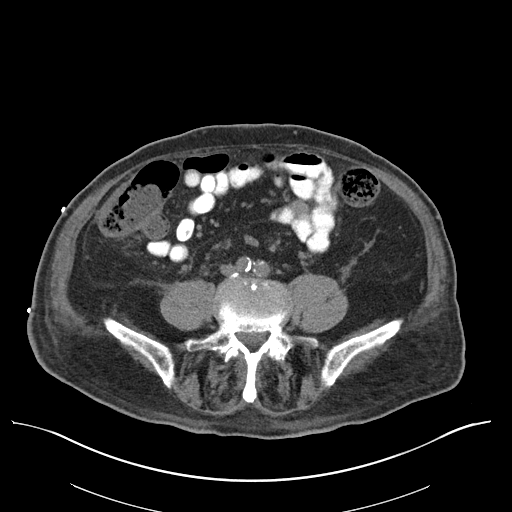
[im 54/103  soft-tissue]
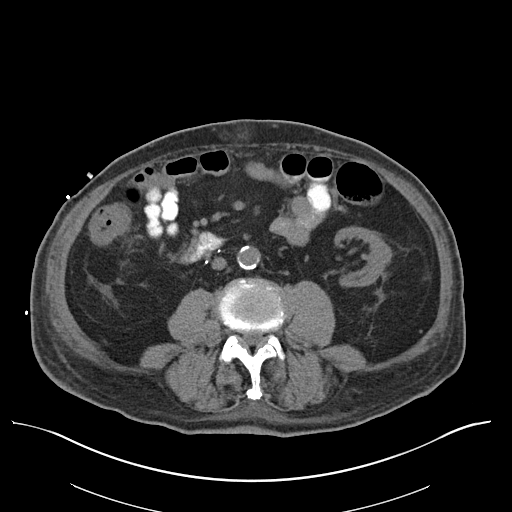
[im 58/103  soft-tissue]
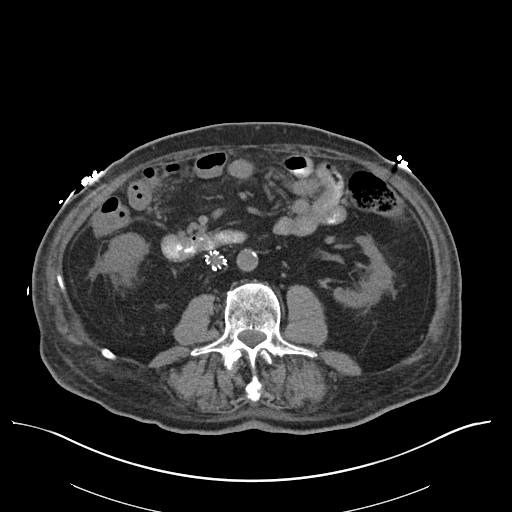
[im 67/103  soft-tissue]
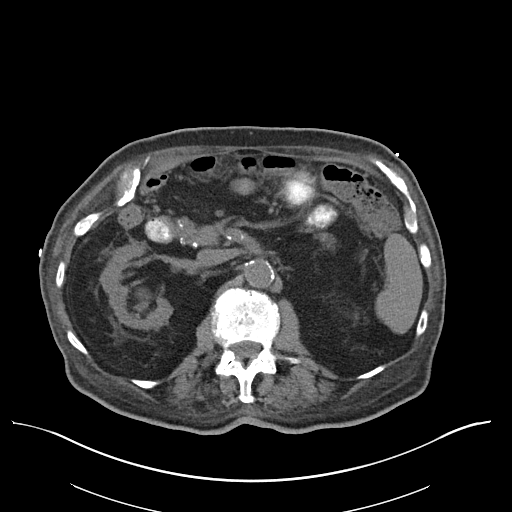
[im 67/103  bone]
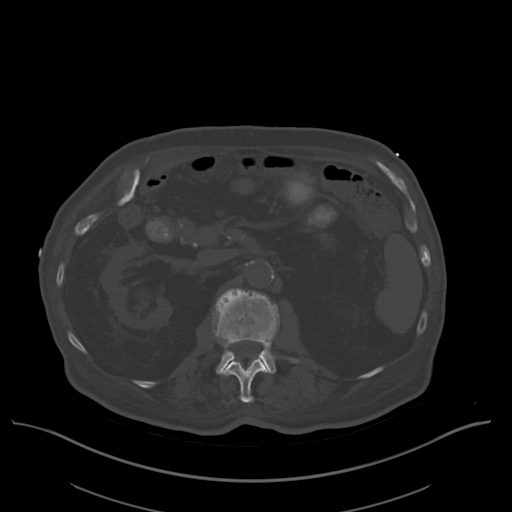
[im 76/103  soft-tissue]
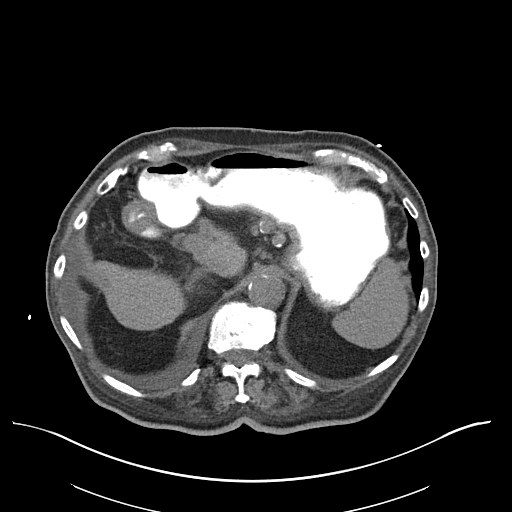
[im 80/103  soft-tissue]
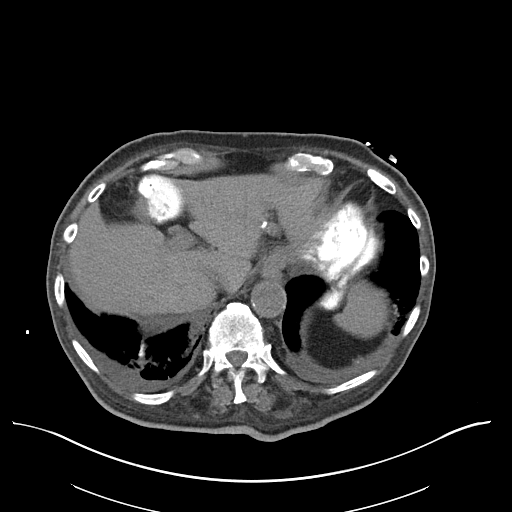
[im 89/103  soft-tissue]
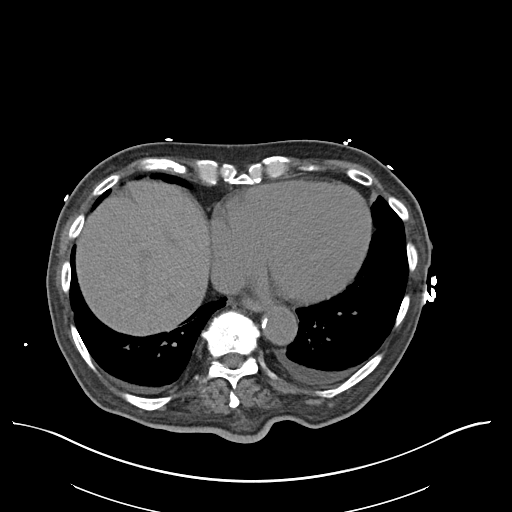
[im 98/103  soft-tissue]
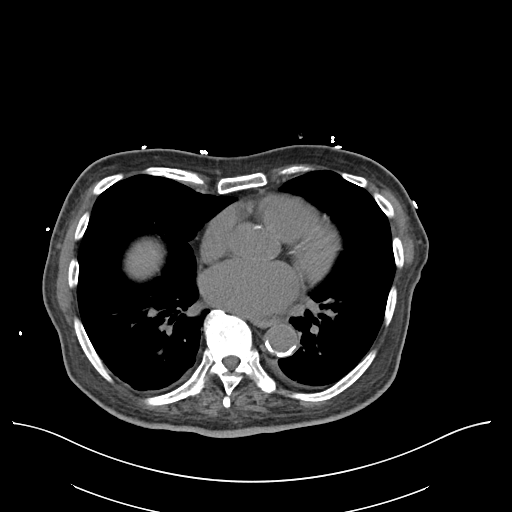

[Series 5: cor st · coronal · 0.98mm/px · 3 of 85 slices shown]
[im 29/85  soft-tissue]
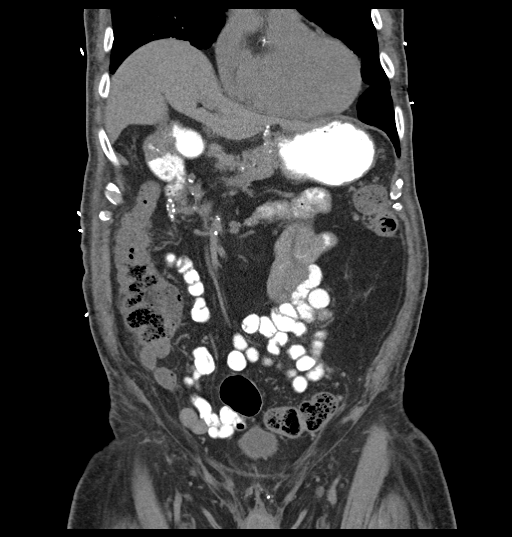
[im 38/85  soft-tissue]
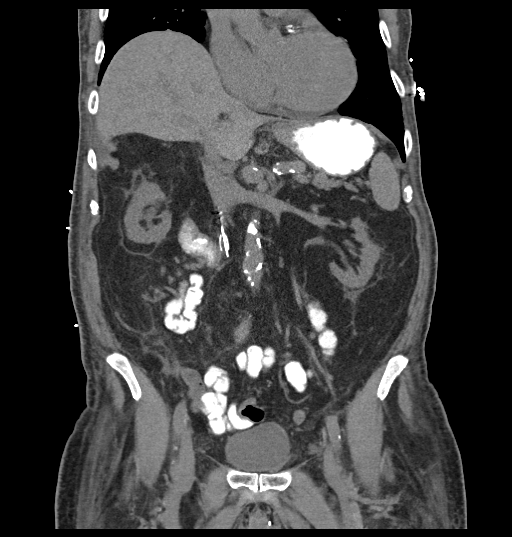
[im 47/85  soft-tissue]
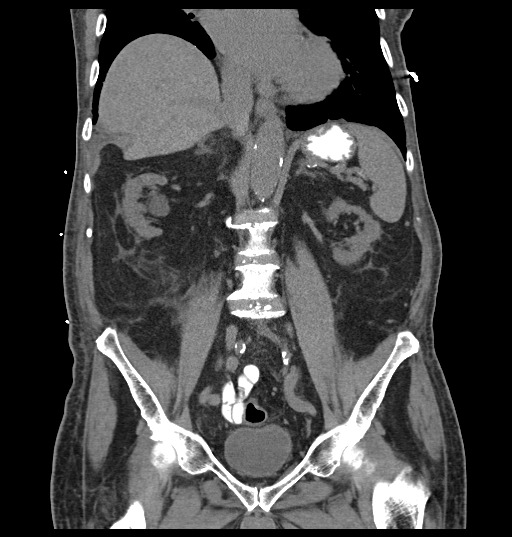

[16 of 46 positions shown; findings below may reference images not displayed]

FINDINGS: Small bibasilar pleural effusions and atelectasis.

Extensive atherosclerotic calcifications including coronary
arteries.

IVC filter noted.

Within limits of a nonenhanced exam, no focal abnormalities of the
liver, spleen, pancreas, or adrenal glands.

Bilateral renal cortical atrophy with a mid right renal cyst 3.8 x
2.5 cm image 40.

No urinary tract calcification or dilatation.

Small supraumbilical ventral hernia containing fat, fascial defect
13 x 12 mm.

Prostatic enlargement, gland 6.5 x 5.0 x 6 8 cm.

Unremarkable bladder and ureters.

Sigmoid diverticulosis without evidence of diverticulitis.

Appendix surgically absent by history.

Nonspecific stranding identified at the inferior aspect of the right
perinephric space and Zuckerkandl's fascia, nonspecific, with
additional mild stranding seen within the presacral fat.

Question bowel wall thickening of ascending colon versus artifact
from underdistention.

Stomach and remaining bowel loops unremarkable.

No mass, adenopathy, ascites or free air.

Bones demineralized.

Bilateral fatty replacement of the gluteus minimus.
IMPRESSION: Small bibasilar pleural effusions and atelectasis.

Small supraumbilical ventral hernia containing fat.

Prostatic enlargement.

Sigmoid diverticulosis without evidence of diverticulitis.

Questionable wall thickening of the ascending colon versus artifact
from underdistention; unable to exclude colonic neoplasm or colitis;
recommend correlation with colonoscopy to exclude tumor.

## 2015-04-23 IMAGING — CR DG CHEST 1V PORT
1 series · 1 of 1 positions shown · non-contrast
Comparison: DG ABD ACUTE W/CHEST dated 01/11/2014

CLINICAL DATA: Confirm line placement

EXAM:
PORTABLE CHEST - 1 VIEW

[AP]
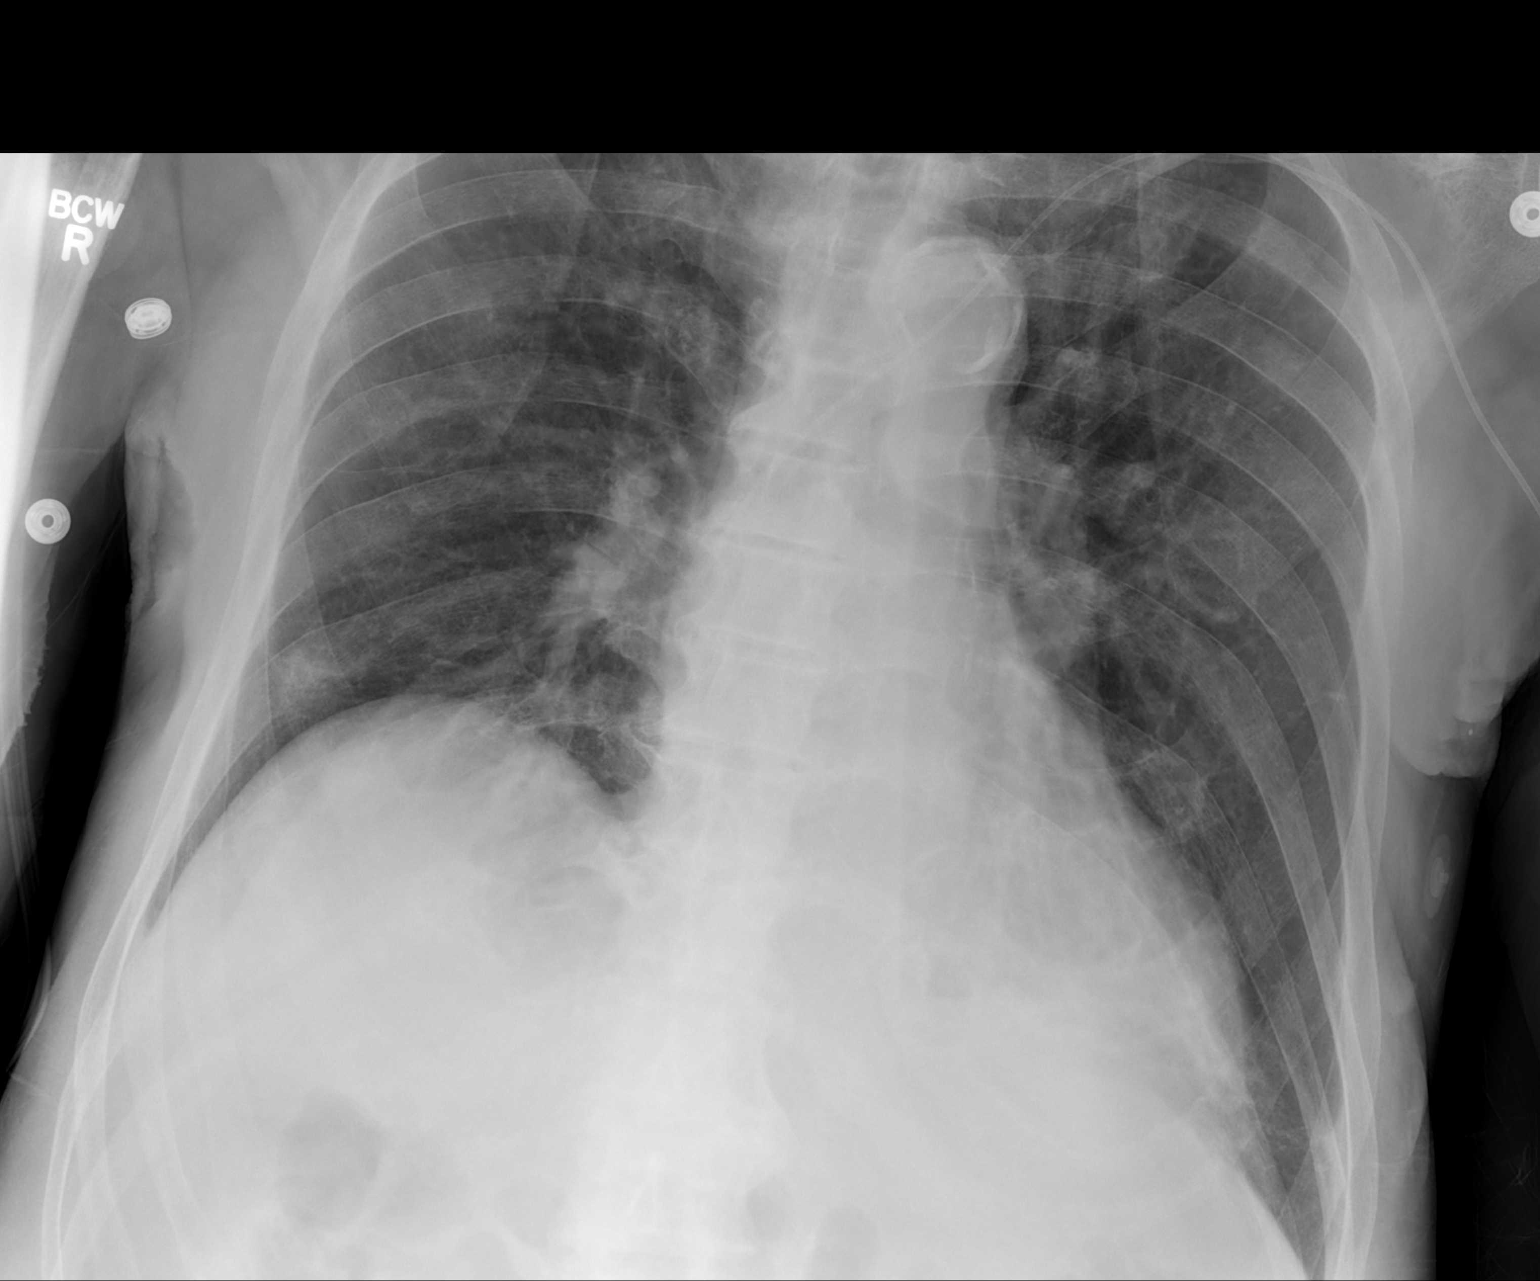

[1 of 1 positions shown; findings below may reference images not displayed]

FINDINGS: A PICC line has been placed via the left upper extremity. The
catheter tip lies in the mid portion of the SVC. There is no
postprocedure complication. The lungs are reasonably well inflated.
There is no focal infiltrate. The cardiac silhouette and pulmonary
vascularity are normal. There is calcification in the wall of the
aortic arch.
IMPRESSION: No postprocedure complication following placement of the left-sided
PICC line.
